# Patient Record
Sex: Female | Born: 1990 | Race: White | Hispanic: No | Marital: Married | Smoking: Never smoker
Health system: Southern US, Community
[De-identification: ages and names within clinical notes are randomized; demographics above are authoritative.]

## PROBLEM LIST (undated history)

## (undated) DIAGNOSIS — F419 Anxiety disorder, unspecified: Secondary | ICD-10-CM

## (undated) DIAGNOSIS — L503 Dermatographic urticaria: Secondary | ICD-10-CM

## (undated) DIAGNOSIS — N809 Endometriosis, unspecified: Secondary | ICD-10-CM

## (undated) DIAGNOSIS — T7840XA Allergy, unspecified, initial encounter: Secondary | ICD-10-CM

## (undated) HISTORY — DX: Anxiety disorder, unspecified: F41.9

## (undated) HISTORY — DX: Allergy, unspecified, initial encounter: T78.40XA

## (undated) HISTORY — PX: FRACTURE SURGERY: SHX138

## (undated) HISTORY — DX: Dermatographic urticaria: L50.3

## (undated) HISTORY — PX: WISDOM TOOTH EXTRACTION: SHX21

## (undated) HISTORY — DX: Endometriosis, unspecified: N80.9

---

## 2013-03-07 ENCOUNTER — Encounter (INDEPENDENT_AMBULATORY_CARE_PROVIDER_SITE_OTHER): Payer: Self-pay

## 2013-03-07 ENCOUNTER — Ambulatory Visit (INDEPENDENT_AMBULATORY_CARE_PROVIDER_SITE_OTHER): Payer: BC Managed Care – PPO | Admitting: General Practice

## 2013-03-07 VITALS — BP 125/81 | HR 74 | Temp 99.2°F | Wt 118.0 lb

## 2013-03-07 DIAGNOSIS — J329 Chronic sinusitis, unspecified: Secondary | ICD-10-CM

## 2013-03-07 MED ORDER — AZITHROMYCIN 250 MG PO TABS: ORAL_TABLET | ORAL | Status: DC

## 2013-03-07 NOTE — Progress Notes (Signed)
  Subjective:    Patient ID: Natalie Price, female    DOB: December 29, 1990, 22 y.o.   MRN: [id number]  Sinusitis This is a new problem. The current episode started in the past 7 days. The problem is unchanged. There has been no fever. Associated symptoms include congestion and sinus pressure. Pertinent negatives include no chills, coughing, headaches or sore throat. Past treatments include nothing.      Review of Systems  Constitutional: Negative for chills.  HENT: Positive for congestion, postnasal drip and sinus pressure. Negative for sore throat.   Respiratory: Negative for cough and chest tightness.   Cardiovascular: Negative for chest pain.  Neurological: Negative for dizziness, weakness and headaches.       Objective:   Physical Exam  Constitutional: She is oriented to person, place, and time. She appears well-developed and well-nourished.  HENT:  Head: Normocephalic and atraumatic.  Right Ear: External ear normal.  Left Ear: External ear normal.  Nose: Right sinus exhibits frontal sinus tenderness. Left sinus exhibits frontal sinus tenderness.  Cardiovascular: Normal rate, regular rhythm and normal heart sounds.   Pulmonary/Chest: Effort normal and breath sounds normal.  Neurological: She is alert and oriented to person, place, and time.  Skin: Skin is warm and dry.  Psychiatric: She has a normal mood and affect.          Assessment & Plan:  1. Sinusitis  - azithromycin (ZITHROMAX) 250 MG tablet; Take as directed  Dispense: 6 tablet; Refill: 0 -increase fluids -change toothbrush in 3 days -RTO if symptoms worsen or unresolved -Patient verbalized understanding Coralie Keens, FNP-C

## 2013-03-07 NOTE — Patient Instructions (Signed)

## 2013-09-03 ENCOUNTER — Ambulatory Visit (INDEPENDENT_AMBULATORY_CARE_PROVIDER_SITE_OTHER): Payer: BC Managed Care – PPO | Admitting: *Deleted

## 2013-09-03 DIAGNOSIS — Z111 Encounter for screening for respiratory tuberculosis: Secondary | ICD-10-CM

## 2013-09-03 NOTE — Patient Instructions (Signed)

## 2013-09-03 NOTE — Progress Notes (Signed)
Ppd placed and patient tolerated well

## 2013-09-06 ENCOUNTER — Encounter: Payer: Self-pay | Admitting: *Deleted

## 2013-09-06 LAB — TB SKIN TEST
Induration: 0 mm
TB Skin Test: NEGATIVE

## 2014-10-14 ENCOUNTER — Encounter (INDEPENDENT_AMBULATORY_CARE_PROVIDER_SITE_OTHER): Payer: Self-pay

## 2014-10-14 ENCOUNTER — Ambulatory Visit (INDEPENDENT_AMBULATORY_CARE_PROVIDER_SITE_OTHER): Payer: BLUE CROSS/BLUE SHIELD | Admitting: Family Medicine

## 2014-10-14 ENCOUNTER — Encounter: Payer: Self-pay | Admitting: Family Medicine

## 2014-10-14 VITALS — BP 105/70 | HR 72 | Temp 98.3°F | Ht 64.0 in | Wt 135.0 lb

## 2014-10-14 DIAGNOSIS — Z111 Encounter for screening for respiratory tuberculosis: Secondary | ICD-10-CM | POA: Diagnosis not present

## 2014-10-14 DIAGNOSIS — Z0289 Encounter for other administrative examinations: Secondary | ICD-10-CM | POA: Diagnosis not present

## 2014-10-14 DIAGNOSIS — Z020 Encounter for examination for admission to educational institution: Secondary | ICD-10-CM

## 2014-10-14 NOTE — Progress Notes (Signed)
   Subjective:    Patient ID: Natalie Price, female    DOB: [birth year], 24 y.o.   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 Basically she is healthy has no complaints or problems today. We will need to check titers complete immunizations if needed    Review of Systems  Constitutional: Negative.   HENT: Negative.   Eyes: Negative.   Respiratory: Negative.   Cardiovascular: Negative.   Gastrointestinal: Negative.   Genitourinary: Negative.   Neurological: Negative.   Psychiatric/Behavioral: Negative.        Objective:   Physical Exam  Constitutional: She is oriented to person, place, and time. She appears well-developed and well-nourished.  Eyes: Conjunctivae and EOM are normal.  Neck: Normal range of motion. Neck supple.  Cardiovascular: Normal rate, regular rhythm and normal heart sounds.   Pulmonary/Chest: Effort normal and breath sounds normal.  Abdominal: Soft. Bowel sounds are normal.  Musculoskeletal: Normal range of motion.  Neurological: She is alert and oriented to person, place, and time. She has normal reflexes.  Skin: Skin is warm and dry.  Psychiatric: She has a normal mood and affect. Her behavior is normal. Thought content normal.          Assessment & Plan:  1. Encounter for TB tine test Will probably need to step test - PPD  2. School physical exam Exam normal. Form completed

## 2014-10-15 LAB — RUBELLA SCREEN: Rubella Antibodies, IGG: 14.3 index (ref >0.99)

## 2014-10-15 LAB — VARICELLA ZOSTER ANTIBODY, IGG: Varicella zoster IgG: 962 index (ref >165)

## 2014-10-16 LAB — TB SKIN TEST
Induration: 0 mm
TB Skin Test: NEGATIVE

## 2014-11-28 ENCOUNTER — Ambulatory Visit (INDEPENDENT_AMBULATORY_CARE_PROVIDER_SITE_OTHER): Payer: BLUE CROSS/BLUE SHIELD | Admitting: *Deleted

## 2014-11-28 DIAGNOSIS — Z111 Encounter for screening for respiratory tuberculosis: Secondary | ICD-10-CM | POA: Diagnosis not present

## 2014-11-28 NOTE — Patient Instructions (Signed)

## 2014-11-28 NOTE — Progress Notes (Signed)
Ppd skin test placed on left forearm and patient tolerated well.

## 2014-11-30 ENCOUNTER — Encounter: Payer: Self-pay | Admitting: *Deleted

## 2014-11-30 LAB — TB SKIN TEST
Induration: 0 mm
TB SKIN TEST: NEGATIVE

## 2015-03-27 ENCOUNTER — Encounter: Payer: Self-pay | Admitting: Pediatrics

## 2015-03-27 ENCOUNTER — Ambulatory Visit (INDEPENDENT_AMBULATORY_CARE_PROVIDER_SITE_OTHER): Payer: BLUE CROSS/BLUE SHIELD | Admitting: Pediatrics

## 2015-03-27 VITALS — BP 119/76 | HR 80 | Temp 97.5°F | Ht 64.0 in | Wt 133.8 lb

## 2015-03-27 DIAGNOSIS — R1084 Generalized abdominal pain: Secondary | ICD-10-CM

## 2015-03-27 DIAGNOSIS — J029 Acute pharyngitis, unspecified: Secondary | ICD-10-CM | POA: Diagnosis not present

## 2015-03-27 DIAGNOSIS — K589 Irritable bowel syndrome without diarrhea: Secondary | ICD-10-CM | POA: Diagnosis not present

## 2015-03-27 LAB — POCT RAPID STREP A (OFFICE): Rapid Strep A Screen: NEGATIVE

## 2015-03-27 NOTE — Patient Instructions (Addendum)
Low FODMAP diet Chloraseptic throat Aleve

## 2015-03-27 NOTE — Progress Notes (Signed)
    Subjective:    Patient ID: Natalie Price, female    DOB: Mar 07, 1991, 24 y.o.   MRN: [id number]  CC: sore throat and abd issues  HPI: Natalie Price is a 24 y.o. female presenting on 03/27/2015 for Sore Throat  No fevers Sore throat starting 2 days ago Poor appetite yesterday Drinking chicken borth Some coughing No N/v Having bloating and gas when she is feeling well with cheese and ice cream.  Milk is fine. Oil and greasy foods make bloating worse Not having abdominal pain so much as gas pain per pt. Lasts for a few hours then goes away. Stooling at least twice a day Constipated around period Bloating/gas pain relieved by defecation  Relevant past medical, surgical, family and social history reviewed and updated as indicated. Interim medical history since our last visit reviewed. Allergies and medications reviewed and updated.   ROS: Per HPI unless specifically indicated above  Past Medical History There are no active problems to display for this patient.   No current outpatient prescriptions on file.   No current facility-administered medications for this visit.       Objective:    BP 119/76 mmHg  Pulse 80  Temp(Src) 97.5 F (36.4 C) (Oral)  Ht $R'5\' 4"'qG$  (1.626 m)  Wt 133 lb 12.8 oz (60.691 kg)  BMI 22.96 kg/m2  Wt Readings from Last 3 Encounters:  03/27/15 133 lb 12.8 oz (60.691 kg)  10/14/14 135 lb (61.236 kg)  03/07/13 118 lb (53.524 kg)     Gen: NAD, alert, cooperative with exam, NCAT EYES: EOMI, no scleral injection or icterus ENT:  TMs pearly gray b/l, OP without erythema LYMPH: no cervical LAD CV: NRRR, normal S1/S2, no murmur Resp: CTABL, no wheezes, normal WOB Abd: +BS, soft, mildly tender throughout, ND. no guarding or organomegaly, neg psoas/obturator Ext: No edema, warm Neuro: Alert and oriented     Assessment & Plan:   Natalie Price was seen today for sore throat and abdominal pain. Likely with URI, negative rapid strep. Will send for culture. Gen  abd pain/bloating worse with certain foods. Possible pt with IBS. Will check labs as below, gave pt handout for FODMAPs diet, foods to avoid and foods that are ok. F/u in 4 weeks.  Diagnoses and all orders for this visit:  Sore throat -     POCT rapid strep A -     Culture, Group A Strep  Generalized abdominal pain -     CMP14+EGFR -     CBC with Differential  Irritable bowel syndrome, unspecified type  Assunta Found, MD [facility] 03/27/2015, 12:22 PM

## 2015-03-28 ENCOUNTER — Ambulatory Visit: Payer: BLUE CROSS/BLUE SHIELD | Admitting: Family

## 2015-03-28 LAB — CMP14+EGFR
ALK PHOS: 56 IU/L (ref 39–117)
ALT: 11 IU/L (ref 0–32)
AST: 13 IU/L (ref 0–40)
Albumin/Globulin Ratio: 1.6 (ref 1.1–2.5)
Albumin: 3.9 g/dL (ref 3.5–5.5)
BILIRUBIN TOTAL: 0.2 mg/dL (ref 0.0–1.2)
BUN/Creatinine Ratio: 11 (ref 8–20)
BUN: 7 mg/dL (ref 6–20)
CHLORIDE: 102 mmol/L (ref 97–106)
CO2: 24 mmol/L (ref 18–29)
CREATININE: 0.66 mg/dL (ref 0.57–1.00)
Calcium: 9 mg/dL (ref 8.7–10.2)
GFR calc Af Amer: 142 mL/min/{1.73_m2} (ref >59)
GFR calc non Af Amer: 123 mL/min/{1.73_m2} (ref >59)
GLUCOSE: 86 mg/dL (ref 65–99)
Globulin, Total: 2.4 g/dL (ref 1.5–4.5)
Potassium: 3.9 mmol/L (ref 3.5–5.2)
SODIUM: 142 mmol/L (ref 136–144)
Total Protein: 6.3 g/dL (ref 6.0–8.5)

## 2015-03-28 LAB — CBC WITH DIFFERENTIAL/PLATELET
Basophils Absolute: 0 10*3/uL (ref 0.0–0.2)
Basos: 0 %
EOS (ABSOLUTE): 0 10*3/uL (ref 0.0–0.4)
EOS: 0 %
HEMATOCRIT: 38.9 % (ref 34.0–46.6)
Hemoglobin: 13.3 g/dL (ref 11.1–15.9)
IMMATURE GRANULOCYTES: 0 %
Immature Grans (Abs): 0 10*3/uL (ref 0.0–0.1)
Lymphocytes Absolute: 1.6 10*3/uL (ref 0.7–3.1)
Lymphs: 20 %
MCH: 32.1 pg (ref 26.6–33.0)
MCHC: 34.2 g/dL (ref 31.5–35.7)
MCV: 94 fL (ref 79–97)
Monocytes Absolute: 0.9 10*3/uL (ref 0.1–0.9)
Monocytes: 11 %
NEUTROS PCT: 69 %
Neutrophils Absolute: 5.7 10*3/uL (ref 1.4–7.0)
Platelets: 228 10*3/uL (ref 150–379)
RBC: 4.14 x10E6/uL (ref 3.77–5.28)
RDW: 12.9 % (ref 12.3–15.4)
WBC: 8.2 10*3/uL (ref 3.4–10.8)

## 2015-03-30 LAB — CULTURE, GROUP A STREP

## 2015-04-25 ENCOUNTER — Telehealth: Payer: Self-pay | Admitting: Pediatrics

## 2015-08-12 ENCOUNTER — Telehealth: Payer: Self-pay | Admitting: Pediatrics

## 2016-10-14 ENCOUNTER — Ambulatory Visit (INDEPENDENT_AMBULATORY_CARE_PROVIDER_SITE_OTHER): Payer: BLUE CROSS/BLUE SHIELD | Admitting: Family Medicine

## 2016-10-14 ENCOUNTER — Encounter: Payer: Self-pay | Admitting: Family Medicine

## 2016-10-14 VITALS — BP 119/88 | HR 70 | Temp 99.5°F | Ht 64.0 in | Wt 133.2 lb

## 2016-10-14 DIAGNOSIS — N3001 Acute cystitis with hematuria: Secondary | ICD-10-CM | POA: Diagnosis not present

## 2016-10-14 LAB — URINALYSIS, COMPLETE
Bilirubin, UA: NEGATIVE
Ketones, UA: NEGATIVE
Nitrite, UA: POSITIVE — AB
PH UA: 7 (ref 5.0–7.5)
Specific Gravity, UA: 1.015 (ref 1.005–1.030)
Urobilinogen, Ur: 0.2 mg/dL (ref 0.2–1.0)

## 2016-10-14 LAB — MICROSCOPIC EXAMINATION

## 2016-10-14 MED ORDER — CIPROFLOXACIN HCL 500 MG PO TABS: 500.0000 mg | ORAL_TABLET | Freq: Two times a day (BID) | ORAL | 0 refills | Status: DC

## 2016-10-14 NOTE — Progress Notes (Signed)
BP 119/88   Pulse 70   Temp 99.5 F (37.5 C) (Oral)   Ht 5\' 4"  (1.626 m)   Wt 133 lb 4 oz (60.4 kg)   BMI 22.87 kg/m    Subjective:    Patient ID: Natalie Price, female    DOB: [birth year], 26 y.o.   MRN: [id number]  HPI: Natalie Price is a 26 y.o. female presenting on 10/14/2016 for Urinary Tract Infection (back pain, nausea, dysuria; began last Friday, taking Azo)   HPI Dysuria and back pain and nausea Patient has been having dysuria and low back pain and nausea. This all started about 6 days ago with dysuria and lower abdominal pain. Over the past couple days and then started to go into her lower back and she has been having some possible low-grade fevers and her temperature is 99.5 here in the office today. She also had some nausea this morning when she awoke as well. She is sexually active but she has a Mirena currently and denies any possibility of being pregnant.  Relevant past medical, surgical, family and social history reviewed and updated as indicated. Interim medical history since our last visit reviewed. Allergies and medications reviewed and updated.  Review of Systems  Constitutional: Negative for chills and fever.  Respiratory: Negative for chest tightness and shortness of breath.   Cardiovascular: Negative for chest pain and leg swelling.  Gastrointestinal: Positive for abdominal pain and nausea. Negative for blood in stool, constipation, diarrhea and vomiting.  Genitourinary: Positive for dysuria, flank pain, frequency, hematuria, urgency and vaginal discharge (Very small amount, patient was not concerned about it today). Negative for difficulty urinating, vaginal bleeding and vaginal pain.  Musculoskeletal: Negative for back pain and gait problem.  Skin: Negative for rash.  Neurological: Negative for light-headedness and headaches.  Psychiatric/Behavioral: Negative for agitation and behavioral problems.  All other systems reviewed and are negative.   Per HPI  unless specifically indicated above      Objective:    BP 119/88   Pulse 70   Temp 99.5 F (37.5 C) (Oral)   Ht 5\' 4"  (1.626 m)   Wt 133 lb 4 oz (60.4 kg)   BMI 22.87 kg/m   Wt Readings from Last 3 Encounters:  10/14/16 133 lb 4 oz (60.4 kg)  03/27/15 133 lb 12.8 oz (60.7 kg)  10/14/14 135 lb (61.2 kg)    Physical Exam  Constitutional: She is oriented to person, place, and time. She appears well-developed and well-nourished. No distress.  Eyes: Conjunctivae are normal.  Cardiovascular: Normal rate, regular rhythm, normal heart sounds and intact distal pulses.   No murmur heard. Pulmonary/Chest: Effort normal and breath sounds normal. No respiratory distress. She has no wheezes. She has no rales.  Abdominal: There is no hepatosplenomegaly. There is tenderness in the suprapubic area. There is CVA tenderness. There is no rigidity, no rebound and no guarding. No hernia.  Musculoskeletal: Normal range of motion.  Neurological: She is alert and oriented to person, place, and time. Coordination normal.  Skin: Skin is warm and dry. No rash noted. She is not diaphoretic.  Psychiatric: She has a normal mood and affect. Her behavior is normal.  Nursing note and vitals reviewed.   Urinalysis: Positive leukocytes and positive nitrite    Assessment & Plan:   Problem List Items Addressed This Visit    None    Visit Diagnoses    Acute cystitis with hematuria    -  Primary   Relevant Medications  ciprofloxacin (CIPRO) 500 MG tablet   Other Relevant Orders   Urinalysis, Complete       Follow up plan: Return if symptoms worsen or fail to improve.  Counseling provided for all of the vaccine components Orders Placed This Encounter  Procedures  . Urinalysis, Complete    Caryl Pina, MD [facility] 10/14/2016, 2:04 PM

## 2018-01-30 ENCOUNTER — Ambulatory Visit: Payer: Self-pay | Admitting: Family Medicine

## 2018-01-30 ENCOUNTER — Encounter: Payer: Self-pay | Admitting: Family Medicine

## 2018-01-30 VITALS — BP 126/84 | HR 74 | Temp 98.3°F | Ht 64.0 in | Wt 148.0 lb

## 2018-01-30 DIAGNOSIS — Z111 Encounter for screening for respiratory tuberculosis: Secondary | ICD-10-CM

## 2018-01-30 DIAGNOSIS — Z020 Encounter for examination for admission to educational institution: Secondary | ICD-10-CM

## 2018-01-30 NOTE — Addendum Note (Signed)
Addended byCarrolyn Leigh on: 01/30/2018 03:34 PM   Modules accepted: Orders

## 2018-01-30 NOTE — Patient Instructions (Signed)
Please return as directed to have your TB test read.  Natalie Price will complete your paperwork at that time.  Tuberculin Skin Test Why am I having this test? Tuberculosis (TB) is a bacterial infection caused by Mycobacterium tuberculosis. Most people who are exposed to these bacteria have a strong enough defense (immune) system to prevent the bacteria from causing TB and developing symptoms. Their bodies prevent the germs from being active and making them sick (latent TB infection). However, if you have TB germs in your body and your immune system is weak, you can develop a TB infection. This can cause symptoms such as:  Night sweats.  Fever.  Weakness.  Weight loss.  A latent TB infection can also become active later in life if your immune system becomes weakened or compromised. You may have this test if your health care provider suspects that you have TB. You may also have this test to screen for TB if you are at risk for getting the disease. Those at increased risk include:  People who inject illegal drugs or share needles.  People with HIV or other diseases that affect immunity.  Health care workers.  People who live in high-risk communities, such as homeless shelters, nursing homes, and correctional facilities.  People who have been in contact with someone with TB.  People from countries where TB is more common.  If you are in a high-risk group, your health care provider may wish to screen for TB more often. This can help prevent the spread of the disease. Sometimes TB screening is required when starting a new job, such as becoming a Public house manager or a Pharmacist, hospital. Colleges or universities may require it of new students. What is being tested? A tuberculin skin test is the main test used to check for exposure to the bacteria that can cause TB. The test checks for antibodies to the bacteria. Antibodies are proteins that your body produces to protect you from germs and other things that  can make you sick. Your health care provider will inject a solution known as PPD (purified protein derivative) under the first layer of skin on your arm. This causes a blister-like bubble to form at the site. Your health care provider will then examine the site after a number of hours have passed to see if a reaction has occurred. How do I prepare for this test? There is no preparation required for this test. What do the results mean? Your test results will be reported as either negative or positive. If the tuberculin skin test produces a negative result, it is likely that you do not have TB and have not been exposed to the TB bacteria. If you or your health care provider suspects exposure, however, you may want to repeat the test a few weeks later. A blood test may also be used to check for TB. This is because you will not react to the tuberculin skin test until several weeks after exposure to TB bacteria. If you test positive to the tuberculin skin test, it is likely that you have been exposed to TB bacteria. The test does not distinguish between an active and a latent TB infection. A false-positive result can occur. A false-positive result for TB bacteria is incorrect because it indicates a condition or finding is present when it is not. Talk to your health care provider to discuss your results, treatment options, and if necessary, the need for more tests. It is your responsibility to obtain your test results. Ask  the lab or department performing the test when and how you will get your results. Talk with your health care provider if you have any questions about your results. Talk with your health care provider to discuss your results, treatment options, and if necessary, the need for more tests. Talk with your health care provider if you have any questions about your results. This information is not intended to replace advice given to you by your health care provider. Make sure you discuss any  questions you have with your health care provider. Document Released: 02/17/2005 Document Revised: 01/11/2016 Document Reviewed: 09/03/2013 Elsevier Interactive Patient Education  [employer].

## 2018-01-30 NOTE — Progress Notes (Signed)
Natalie Price is a 27 y.o. female presents to office today for annual physical exam examination.    Concerns today include: 1.  Needs school forms completed.  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  She gets her female exam/Pap performed with [facility], Dr. Lynnette Caffey.  She notes last Pap was last year and was within normal limits.  She currently has an IUD in place.  Therefore, menstrual cycles are not regular nor heavy.  Occupation: Ship broker at [employer], Substance use: none Last dental exam: regular Last pap smear: w/ OBGYN last year Refills needed today: none Immunizations needed: Flu Vaccine: YES; Needs PPD  History reviewed. No pertinent past medical history. Social History   Socioeconomic History  . Marital status: Single    Spouse name: Not on file  . Number of children: Not on file  . Years of education: Not on file  . Highest education level: Not on file  Occupational History  . Not on file  Social Needs  . Financial resource strain: Not on file  . Food insecurity:    Worry: Not on file    Inability: Not on file  . Transportation needs:    Medical: Not on file    Non-medical: Not on file  Tobacco Use  . Smoking status: Never Smoker  . Smokeless tobacco: Never Used  Substance and Sexual Activity  . Alcohol use: No  . Drug use: No  . Sexual activity: Yes  Lifestyle  . Physical activity:    Days per week: Not on file    Minutes per session: Not on file  . Stress: Not on file  Relationships  . Social connections:    Talks on phone: Not on file    Gets together: Not on file    Attends religious service: Not on file    Active member of club or organization: Not on file    Attends meetings of clubs or organizations: Not on file    Relationship status: Not on file  . Intimate partner violence:    Fear of current or ex partner: Not on file    Emotionally abused: Not on file    Physically abused: Not  on file    Forced sexual activity: Not on file  Other Topics Concern  . Not on file  Social History Narrative  . Not on file   Past Surgical History:  Procedure Laterality Date  . FRACTURE SURGERY     LEFT ARM  . WISDOM TOOTH EXTRACTION     History reviewed. No pertinent family history. No current outpatient medications on file.  No Known Allergies   ROS: Review of Systems Constitutional: negative Eyes: negative Ears, nose, mouth, throat, and face: negative Respiratory: negative Cardiovascular: negative Gastrointestinal: negative Genitourinary:negative Integument/breast: positive for skin lesion(s) Hematologic/lymphatic: negative Musculoskeletal:negative Neurological: negative Behavioral/Psych: negative Endocrine: negative Allergic/Immunologic: negative    Physical exam BP 126/84   Pulse 74   Temp 98.3 F (36.8 C) (Oral)   Ht 5\' 4"  (1.626 m)   Wt 148 lb (67.1 kg)   BMI 25.40 kg/m  General appearance: alert, cooperative, appears stated age and no distress Head: Normocephalic, without obvious abnormality, atraumatic Eyes: negative findings: lids and lashes normal, conjunctivae and sclerae normal, corneas clear and pupils equal, round, reactive to light and accomodation Ears: normal TM's and external ear canals both ears Nose: Nares normal. Septum midline. Mucosa normal. No drainage or sinus tenderness. Throat: lips, mucosa, and  tongue normal; teeth and gums normal Neck: no adenopathy, supple, symmetrical, trachea midline and thyroid not enlarged, symmetric, no tenderness/mass/nodules Back: symmetric, no curvature. ROM normal. No CVA tenderness. Lungs: clear to auscultation bilaterally Breasts: not examined Heart: regular rate and rhythm, S1, S2 normal, no murmur, click, rub or gallop Abdomen: soft, non-tender; bowel sounds normal; no masses,  no organomegaly Pelvic: not examined Extremities: extremities normal, atraumatic, no cyanosis or edema Pulses: 2+ and  symmetric Skin: Skin color, texture, turgor normal. No rashes or lesions; she has a 2-1/2 mm x 2 and half millimeter well-circumscribed flesh-colored lesion on the anterior knee on the right.  No associated fluctuance, erythema or central punctum. Lymph nodes: Cervical, supraclavicular, and axillary nodes normal. Neurologic: Grossly normal  Psych: Mood stable, speech normal, affect appropriate, pleasant, engaging Depression screen [facility] 2/9 01/30/2018 10/14/2016  Decreased Interest 0 0  Down, Depressed, Hopeless 0 0  PHQ - 2 Score 0 0    Assessment/ Plan: Natalie Price here for annual physical exam.   1. School physical exam Normal physical exam.  School form completed.  PPD test administered today.  She signed a release of information form to obtain well woman exam and Pap results from OB[facility]N at [facility].  She will call to schedule an appointment for influenza shot once is available.  2. Screening for tuberculosis  Patient to follow up in 1 year for annual exam or sooner if needed.  Aksel Bencomo M. Lajuana Ripple, DO

## 2018-02-02 LAB — TB SKIN TEST
Induration: 0 mm
TB Skin Test: NEGATIVE

## 2018-02-16 ENCOUNTER — Ambulatory Visit (INDEPENDENT_AMBULATORY_CARE_PROVIDER_SITE_OTHER): Payer: Self-pay | Admitting: *Deleted

## 2018-02-16 DIAGNOSIS — Z23 Encounter for immunization: Secondary | ICD-10-CM

## 2018-02-16 DIAGNOSIS — Z111 Encounter for screening for respiratory tuberculosis: Secondary | ICD-10-CM

## 2018-02-16 NOTE — Progress Notes (Signed)
PPD placed R forearm Influenza vaccine given Pt tolerated well

## 2018-02-18 ENCOUNTER — Encounter: Payer: Self-pay | Admitting: *Deleted

## 2018-02-18 LAB — TB SKIN TEST
INDURATION: 0 mm
TB Skin Test: NEGATIVE

## 2018-07-10 ENCOUNTER — Ambulatory Visit: Payer: Self-pay | Admitting: Family

## 2018-07-10 ENCOUNTER — Other Ambulatory Visit: Payer: Self-pay | Admitting: Family

## 2018-07-10 ENCOUNTER — Ambulatory Visit (INDEPENDENT_AMBULATORY_CARE_PROVIDER_SITE_OTHER): Payer: Self-pay | Admitting: Family

## 2018-07-10 ENCOUNTER — Encounter: Payer: Self-pay | Admitting: Family

## 2018-07-10 VITALS — BP 125/85 | HR 85 | Temp 97.6°F | Ht 64.0 in | Wt 148.0 lb

## 2018-07-10 DIAGNOSIS — N912 Amenorrhea, unspecified: Secondary | ICD-10-CM

## 2018-07-10 DIAGNOSIS — Z3200 Encounter for pregnancy test, result unknown: Secondary | ICD-10-CM

## 2018-07-10 DIAGNOSIS — Z975 Presence of (intrauterine) contraceptive device: Secondary | ICD-10-CM

## 2018-07-10 LAB — PREGNANCY, URINE: PREG TEST UR: NEGATIVE

## 2018-07-10 NOTE — Progress Notes (Signed)
   Subjective:    Patient ID: Natalie Price, female    DOB: [birth year], 29 y.o.   MRN: [id number]  Chief Complaint  Patient presents with  . check for pregnancy  . Nausea    HPI Pt presents to the office today for pregnancy testing. States she taken several pregnancy tests and has 4 positive tests in the AM, but has had several negative one if she tests in the afternoon.   States she has an IUD that placed two days ago. She states she had light amount of bleeding for 3 days on 06/27/18.   She reports nausea, vomiting, sensitive to smells, headaches, dizziness, and mild cramping.   She is sexually active.     Review of Systems  Gastrointestinal: Positive for nausea.  All other systems reviewed and are negative.      Objective:   Physical Exam Vitals signs reviewed.  Constitutional:      General: She is not in acute distress.    Appearance: She is well-developed.  HENT:     Head: Normocephalic and atraumatic.  Eyes:     Pupils: Pupils are equal, round, and reactive to light.  Neck:     Musculoskeletal: Normal range of motion and neck supple.     Thyroid: No thyromegaly.  Cardiovascular:     Rate and Rhythm: Normal rate and regular rhythm.     Heart sounds: Normal heart sounds. No murmur.  Pulmonary:     Effort: Pulmonary effort is normal. No respiratory distress.     Breath sounds: Normal breath sounds. No wheezing.  Abdominal:     General: Bowel sounds are normal. There is no distension.     Palpations: Abdomen is soft.     Tenderness: There is no abdominal tenderness.  Musculoskeletal: Normal range of motion.        General: No tenderness.  Skin:    General: Skin is warm and dry.  Neurological:     Mental Status: She is alert and oriented to person, place, and time.     Cranial Nerves: No cranial nerve deficit.     Deep Tendon Reflexes: Reflexes are normal and symmetric.  Psychiatric:        Behavior: Behavior normal.        Thought Content: Thought content  normal.        Judgment: Judgment normal.      BP 125/85   Pulse 85   Temp 97.6 F (36.4 C) (Oral)   Ht 5\' 4"  (1.626 m)   Wt 148 lb (67.1 kg)   BMI 25.40 kg/m      Assessment & Plan:  Natalie Price comes in today with chief complaint of check for pregnancy and Nausea   Diagnosis and orders addressed:  1. Absent menses - Pregnancy, urine  2. Possible pregnancy, not confirmed - Beta hCG quant (ref lab)   3. IUD (intrauterine device) in place   Labs pending to confirm pregnancy If positive will remove IUD and do referral to [place], FNP

## 2018-07-10 NOTE — Patient Instructions (Signed)
Home Pregnancy Test Information    A home pregnancy test helps you determine whether you are pregnant or not. There are several types of home pregnancy tests that can be bought at a grocery store or pharmacy.  What is being tested?  A home pregnancy test detects the presence of a hormone in your urine. The hormone is produced by cells of the placenta (human chorionic gonadotropin, or hCG). The placenta is the organ that forms to nourish and support a developing baby.  How are pregnancy tests done?  Home pregnancy tests require a urine sample.   Most kits use a plastic testing device with a strip of paper that indicates whether there is hCG in your urine.   Follow the test package instructions very carefully for how to test your urine. Depending on the test, you may need to:  ? Urinate directly onto the stick.  ? Urinate into a cup.   Wait for the results as directed by the package instructions. The amount of time may be different for each type of test.   Follow the test package instructions for how to read your test results. Depending on the test, results may be displayed as:  ? A plus or a minus sign.  ? One or two lines.  ? "Pregnant" or "not pregnant."   For best results, use your first urine of the morning. That is when the concentration of hCG is highest.  How accurate are home pregnancy tests?  Home pregnancy tests are very accurate when:   You are at least 3-[redacted] weeks pregnant.   It has been 1-2 weeks since your missed period.   You use the test according to the package instructions.  What can interfere with home pregnancy test results?  Sometimes, a home pregnancy test may report that you are pregnant when you are not pregnant (false-positive result). This can happen if you:   Are taking certain medicines, such as:  ? Medicine to control seizures.  ? Anti-anxiety medicine.  ? Fertility medicine with hCG.   Have a medical condition that affects your hormone levels.   Had a recent pregnancy loss  (miscarriage) or abortion.  Sometimes, a home pregnancy test may report that you are not pregnant when you are pregnant (false-negative result). This can happen if you:   Took the test too early in your pregnancy. Before 3-4 weeks of pregnancy, there may not be enough hCG to detect.   Drank a lot of liquid before the test.   Used an expired pregnancy test.   Are taking certain medicines, such as antihistamines or water pills (diuretics).  What should I do if I have a positive pregnancy test?  If you have a positive home pregnancy test, schedule an appointment with your health care provider. You might need additional testing to confirm the pregnancy.  What should I do if I have a negative pregnancy test?  If you have a negative home pregnancy test but still have symptoms of pregnancy, contact your health care provider. Your health care provider will test a sample of your blood to check for pregnancy. In some cases, a blood test will return a positive result even if a urine test was negative because blood tests are more sensitive. This means blood tests can detect hCG earlier than home pregnancy tests.  Follow these instructions at home:  If you are pregnant, planning to become pregnant, or think you may be pregnant:   Do not drink alcohol.   Do not   use street drugs.   Do not use any products that contain nicotine or tobacco, such as cigarettes and e-cigarettes. If you need help quitting, ask your health care provider.   Take a prenatal vitamin that contains at least 400 mcg of folic acid daily.  Summary   A home pregnancy test helps you determine whether you are pregnant or not by detecting the presence of the hormone human chorionic gonadotropin (hCG) in a sample of your urine.   Follow the test package instructions very carefully. For best results, use your first urine of the morning. That is when the concentration of hCG is highest.   Home pregnancy tests are very accurate when you are 3-[redacted] weeks  pregnant or when it has been 1-2 weeks since your missed period.   A home pregnancy test may report that you are pregnant when you are not pregnant or that you are not pregnant when you are pregnant.   Contact your health care provider to confirm your results. Your health care provider will test a sample of your blood to check for pregnancy.  This information is not intended to replace advice given to you by your health care provider. Make sure you discuss any questions you have with your health care provider.  Document Released: 05/13/2003 Document Revised: 05/23/2017 Document Reviewed: 05/23/2017  [employer] Interactive Patient Education  2019 [employer]

## 2018-07-11 ENCOUNTER — Telehealth: Payer: Self-pay | Admitting: *Deleted

## 2018-07-11 ENCOUNTER — Telehealth: Payer: Self-pay | Admitting: Family

## 2018-07-11 LAB — BETA HCG QUANT (REF LAB): hCG Quant: <1 m[IU]/mL

## 2018-07-11 NOTE — Telephone Encounter (Signed)
Requesting blood results on pregnancy.

## 2018-07-11 NOTE — Telephone Encounter (Signed)
Patient says she is sure the IUD is still in place. First morning void shows positive but  All others show neg. I suggested she go see her gyn in morning to be checked and possibly have U/s. I also told her to take her first morning void with er to the doctor. Patient stated she understood.

## 2018-07-11 NOTE — Telephone Encounter (Signed)
They are negative

## 2018-07-11 NOTE — Telephone Encounter (Signed)
Aware. 

## 2018-11-01 ENCOUNTER — Emergency Department (HOSPITAL_COMMUNITY): Payer: Self-pay

## 2018-11-01 ENCOUNTER — Encounter: Payer: Self-pay | Admitting: Family Medicine

## 2018-11-01 ENCOUNTER — Other Ambulatory Visit: Payer: Self-pay

## 2018-11-01 ENCOUNTER — Ambulatory Visit (INDEPENDENT_AMBULATORY_CARE_PROVIDER_SITE_OTHER): Payer: Self-pay | Admitting: Family Medicine

## 2018-11-01 ENCOUNTER — Encounter (HOSPITAL_COMMUNITY): Payer: Self-pay

## 2018-11-01 ENCOUNTER — Emergency Department (HOSPITAL_COMMUNITY)
Admission: EM | Admit: 2018-11-01 | Discharge: 2018-11-01 | Disposition: A | Discharge status: Home / Self Care | Payer: Self-pay | Attending: Emergency Medicine | Admitting: Emergency Medicine

## 2018-11-01 DIAGNOSIS — M545 Low back pain, unspecified: Secondary | ICD-10-CM

## 2018-11-01 DIAGNOSIS — N83202 Unspecified ovarian cyst, left side: Secondary | ICD-10-CM | POA: Insufficient documentation

## 2018-11-01 DIAGNOSIS — Z79899 Other long term (current) drug therapy: Secondary | ICD-10-CM | POA: Insufficient documentation

## 2018-11-01 DIAGNOSIS — N83201 Unspecified ovarian cyst, right side: Secondary | ICD-10-CM | POA: Insufficient documentation

## 2018-11-01 DIAGNOSIS — R109 Unspecified abdominal pain: Secondary | ICD-10-CM

## 2018-11-01 DIAGNOSIS — R1030 Lower abdominal pain, unspecified: Secondary | ICD-10-CM

## 2018-11-01 LAB — CBC WITH DIFFERENTIAL/PLATELET
Abs Immature Granulocytes: 0.05 10*3/uL (ref 0.00–0.07)
Basophils Absolute: 0 10*3/uL (ref 0.0–0.1)
Basophils Relative: 0 %
Eosinophils Absolute: 0.1 10*3/uL (ref 0.0–0.5)
Eosinophils Relative: 1 %
HCT: 43 % (ref 36.0–46.0)
Hemoglobin: 14.2 g/dL (ref 12.0–15.0)
Immature Granulocytes: 1 %
Lymphocytes Relative: 26 %
Lymphs Abs: 2.6 10*3/uL (ref 0.7–4.0)
MCH: 32.3 pg (ref 26.0–34.0)
MCHC: 33 g/dL (ref 30.0–36.0)
MCV: 97.7 fL (ref 80.0–100.0)
Monocytes Absolute: 0.5 10*3/uL (ref 0.1–1.0)
Monocytes Relative: 5 %
Neutro Abs: 6.8 10*3/uL (ref 1.7–7.7)
Neutrophils Relative %: 67 %
Platelets: 443 10*3/uL — ABNORMAL HIGH (ref 150–400)
RBC: 4.4 MIL/uL (ref 3.87–5.11)
RDW: 11.9 % (ref 11.5–15.5)
WBC: 10.1 10*3/uL (ref 4.0–10.5)
nRBC: 0 % (ref 0.0–0.2)

## 2018-11-01 LAB — URINALYSIS, ROUTINE W REFLEX MICROSCOPIC
Bilirubin Urine: NEGATIVE
Glucose, UA: NEGATIVE mg/dL
Hgb urine dipstick: NEGATIVE
Ketones, ur: NEGATIVE mg/dL
Leukocytes,Ua: NEGATIVE
Nitrite: NEGATIVE
Protein, ur: NEGATIVE mg/dL
Specific Gravity, Urine: 1.016 (ref 1.005–1.030)
pH: 5 (ref 5.0–8.0)

## 2018-11-01 LAB — COMPREHENSIVE METABOLIC PANEL
ALT: 13 U/L (ref 0–44)
AST: 15 U/L (ref 15–41)
Albumin: 4.4 g/dL (ref 3.5–5.0)
Alkaline Phosphatase: 69 U/L (ref 38–126)
Anion gap: 16 — ABNORMAL HIGH (ref 5–15)
BUN: 13 mg/dL (ref 6–20)
CO2: 19 mmol/L — ABNORMAL LOW (ref 22–32)
Calcium: 9.3 mg/dL (ref 8.9–10.3)
Chloride: 105 mmol/L (ref 98–111)
Creatinine, Ser: 0.61 mg/dL (ref 0.44–1.00)
GFR calc Af Amer: >60 mL/min (ref >60)
GFR calc non Af Amer: >60 mL/min (ref >60)
Glucose, Bld: 124 mg/dL — ABNORMAL HIGH (ref 70–99)
Potassium: 3.5 mmol/L (ref 3.5–5.1)
Sodium: 140 mmol/L (ref 135–145)
Total Bilirubin: 0.5 mg/dL (ref 0.3–1.2)
Total Protein: 7.9 g/dL (ref 6.5–8.1)

## 2018-11-01 LAB — LIPASE, BLOOD: Lipase: 22 U/L (ref 11–51)

## 2018-11-01 LAB — SEDIMENTATION RATE: Sed Rate: 25 mm/hr — ABNORMAL HIGH (ref 0–22)

## 2018-11-01 LAB — PREGNANCY, URINE: Preg Test, Ur: NEGATIVE

## 2018-11-01 LAB — C-REACTIVE PROTEIN: CRP: 1 mg/dL — ABNORMAL HIGH (ref <1.0)

## 2018-11-01 MED ORDER — FENTANYL CITRATE (PF) 100 MCG/2ML IJ SOLN
50.0000 ug | Freq: Once | INTRAMUSCULAR | Status: AC
  Administered 2018-11-01: 50 ug via INTRAVENOUS
  Filled 2018-11-01: qty 2

## 2018-11-01 MED ORDER — IOHEXOL 300 MG/ML  SOLN
100.0000 mL | Freq: Once | INTRAMUSCULAR | Status: AC | PRN
  Administered 2018-11-01: 100 mL via INTRAVENOUS

## 2018-11-01 MED ORDER — HYDROMORPHONE HCL 1 MG/ML IJ SOLN
1.0000 mg | Freq: Once | INTRAMUSCULAR | Status: AC
  Administered 2018-11-01: 1 mg via INTRAVENOUS
  Filled 2018-11-01: qty 1

## 2018-11-01 MED ORDER — NAPROXEN 375 MG PO TABS: 375.0000 mg | ORAL_TABLET | Freq: Two times a day (BID) | ORAL | 0 refills | Status: DC

## 2018-11-01 MED ORDER — SODIUM CHLORIDE 0.9 % IV BOLUS
1000.0000 mL | Freq: Once | INTRAVENOUS | Status: AC
  Administered 2018-11-01: 1000 mL via INTRAVENOUS

## 2018-11-01 MED ORDER — METOCLOPRAMIDE HCL 5 MG/ML IJ SOLN
10.0000 mg | Freq: Once | INTRAMUSCULAR | Status: AC
  Administered 2018-11-01: 10 mg via INTRAVENOUS
  Filled 2018-11-01: qty 2

## 2018-11-01 MED ORDER — ONDANSETRON HCL 4 MG/2ML IJ SOLN
4.0000 mg | Freq: Once | INTRAMUSCULAR | Status: AC
  Administered 2018-11-01: 4 mg via INTRAVENOUS
  Filled 2018-11-01: qty 2

## 2018-11-01 MED ORDER — ONDANSETRON HCL 4 MG/2ML IJ SOLN
4.0000 mg | Freq: Once | INTRAMUSCULAR | Status: AC
  Administered 2018-11-01: 14:00:00 4 mg via INTRAVENOUS
  Filled 2018-11-01: qty 2

## 2018-11-01 MED ORDER — HYDROMORPHONE HCL 1 MG/ML IJ SOLN
0.5000 mg | Freq: Once | INTRAMUSCULAR | Status: AC
  Administered 2018-11-01: 0.5 mg via INTRAVENOUS
  Filled 2018-11-01: qty 1

## 2018-11-01 MED ORDER — PROMETHAZINE HCL 25 MG/ML IJ SOLN
12.5000 mg | Freq: Once | INTRAMUSCULAR | Status: AC
  Administered 2018-11-01: 12.5 mg via INTRAVENOUS
  Filled 2018-11-01: qty 1

## 2018-11-01 MED ORDER — PROMETHAZINE HCL 25 MG PO TABS: 12.5000 mg | ORAL_TABLET | Freq: Four times a day (QID) | ORAL | 0 refills | Status: DC | PRN

## 2018-11-01 MED ORDER — OXYCODONE HCL 5 MG PO TABS: 2.5000 mg | ORAL_TABLET | Freq: Four times a day (QID) | ORAL | 0 refills | Status: DC | PRN

## 2018-11-01 NOTE — ED Notes (Signed)
Dr Pryor Curia wants to be notified after pt has ultrasound @ [phone]

## 2018-11-01 NOTE — ED Triage Notes (Signed)
Pt reports lower abd pain and back pain x 3 days.  Reports started lmp on June 1 and ended 4 days ago.  Reports period was light.  Pt started having pain 3 days ago.  Reports nausea, no vomiting or diarrhea.  Pt started vomiting after triage.  LBM was this morning.  Reports called her doctor and they wanted her to come to ED to rule out ectopic.

## 2018-11-01 NOTE — ED Provider Notes (Signed)
[facility] EMERGENCY DEPARTMENT Provider Note   CSN: [id number] Arrival date & time: 11/01/18  0941    History   Chief Complaint Chief Complaint  Patient presents with   Abdominal Pain    HPI Natalie Price is a 28 y.o. female      Flank Pain  This is a new problem. The current episode started 2 days ago. The problem occurs constantly. The problem has been gradually worsening. Associated symptoms include abdominal pain. Pertinent negatives include no shortness of breath. The symptoms are aggravated by bending, twisting, coughing and walking. Nothing relieves the symptoms. She has tried acetaminophen for the symptoms. The treatment provided no relief.  Abdominal Pain  Pain location:  Suprapubic Pain quality: aching, bloating and heavy   Pain radiates to:  Does not radiate Pain severity:  Severe Onset quality:  Gradual Duration:  2 days Timing:  Constant Progression:  Worsening Chronicity:  New Context: awakening from sleep   Context: not alcohol use, not diet changes, not eating, not laxative use, not medication withdrawal, not previous surgeries, not recent illness, not recent sexual activity, not recent travel, not retching, not sick contacts, not suspicious food intake and not trauma   Worsened by:  Movement, palpation, coughing and position changes Ineffective treatments:  OTC medications Associated symptoms: anorexia, fatigue, nausea and vomiting   Associated symptoms: no chills, no constipation, no cough, no diarrhea, no dysuria, no fever, no flatus, no hematemesis, no hematochezia, no hematuria, no melena, no shortness of breath, no sore throat, no vaginal bleeding and no vaginal discharge     History reviewed. No pertinent past medical history.  There are no active problems to display for this patient.   Past Surgical History:  Procedure Laterality Date   FRACTURE SURGERY     LEFT ARM   WISDOM TOOTH EXTRACTION       OB History    Gravida  1   Para        Term      Preterm      AB      Living        SAB      TAB      Ectopic      Multiple      Live Births               Home Medications    Prior to Admission medications   Medication Sig Start Date End Date Taking? Authorizing Provider  acetaminophen (TYLENOL) 500 MG tablet Take 1,000 mg by mouth every 6 (six) hours as needed for mild pain or moderate pain.   Yes [provider]  Prenatal Vit-Fe Fumarate-FA (PRENATAL VITAMIN PO) Take 1 tablet by mouth daily.   Yes [provider]  naproxen (NAPROSYN) 375 MG tablet Take 1 tablet (375 mg total) by mouth 2 (two) times daily. 11/01/18   [place] Fischel, Vernie Shanks, PA-C  oxyCODONE (ROXICODONE) 5 MG immediate release tablet Take 0.5-1 tablets (2.5-5 mg total) by mouth every 6 (six) hours as needed for severe pain. 11/01/18   Margarita Mail, PA-C  promethazine (PHENERGAN) 25 MG tablet Take 0.5-1 tablets (12.5-25 mg total) by mouth every 6 (six) hours as needed for nausea or vomiting. 11/01/18   Margarita Mail, PA-C    Family History No family history on file.  Social History Social History   Tobacco Use   Smoking status: Never Smoker   Smokeless tobacco: Never Used  Substance Use Topics   Alcohol use: No   Drug use:  No     Allergies   Patient has no known allergies.   Review of Systems Review of Systems  Constitutional: Positive for fatigue. Negative for chills and fever.  HENT: Negative for sore throat.   Respiratory: Negative for cough and shortness of breath.   Gastrointestinal: Positive for abdominal pain, anorexia, nausea and vomiting. Negative for constipation, diarrhea, flatus, hematemesis, hematochezia and melena.  Genitourinary: Positive for flank pain. Negative for difficulty urinating, dyspareunia, dysuria, hematuria, vaginal bleeding and vaginal discharge.  All other systems reviewed and are negative.    Physical Exam Updated Vital Signs BP 114/75    Pulse (!) 104    Temp 98.5 F  (36.9 C) (Oral)    Resp 16    Ht 5\' 4"  (1.626 m)    Wt 68 kg    LMP 10/23/2018    SpO2 99%    BMI 25.75 kg/m   Physical Exam Vitals signs and nursing note reviewed.  Constitutional:      General: She is not in acute distress.    Appearance: She is well-developed. She is not diaphoretic.  HENT:     Head: Normocephalic and atraumatic.  Eyes:     General: No scleral icterus.    Conjunctiva/sclera: Conjunctivae normal.  Neck:     Musculoskeletal: Normal range of motion.  Cardiovascular:     Rate and Rhythm: Normal rate and regular rhythm.     Heart sounds: Normal heart sounds. No murmur. No friction rub. No gallop.   Pulmonary:     Effort: Pulmonary effort is normal. No respiratory distress.     Breath sounds: Normal breath sounds.  Abdominal:     General: Bowel sounds are normal. There is no distension.     Palpations: Abdomen is rigid. There is no mass.     Tenderness: There is generalized abdominal tenderness and tenderness in the suprapubic area. There is guarding. There is no right CVA tenderness or left CVA tenderness.  Skin:    General: Skin is warm and dry.  Neurological:     Mental Status: She is alert and oriented to person, place, and time.  Psychiatric:        Behavior: Behavior normal.      ED Treatments / Results  Labs (all labs ordered are listed, but only abnormal results are displayed) Labs Reviewed  CBC WITH DIFFERENTIAL/PLATELET - Abnormal; Notable for the following components:      Result Value   Platelets 443 (*)    All other components within normal limits  COMPREHENSIVE METABOLIC PANEL - Abnormal; Notable for the following components:   CO2 19 (*)    Glucose, Bld 124 (*)    Anion gap 16 (*)    All other components within normal limits  URINALYSIS, ROUTINE W REFLEX MICROSCOPIC - Abnormal; Notable for the following components:   APPearance HAZY (*)    All other components within normal limits  SEDIMENTATION RATE - Abnormal; Notable for the  following components:   Sed Rate 25 (*)    All other components within normal limits  C-REACTIVE PROTEIN - Abnormal; Notable for the following components:   CRP 1.0 (*)    All other components within normal limits  PREGNANCY, URINE  LIPASE, BLOOD    EKG None  Radiology US Pelvis Complete  Result Date: 11/01/2018 CLINICAL DATA:  Bilateral ovarian masses. EXAM: TRANSABDOMINAL ULTRASOUND OF PELVIS DOPPLER ULTRASOUND OF OVARIES TECHNIQUE: Transabdominal ultrasound examination of the pelvis was performed including evaluation of the uterus, ovaries, adnexal  regions, and pelvic cul-de-sac. Color and duplex Doppler ultrasound was utilized to evaluate blood flow to the ovaries. COMPARISON:  CT scan November 01, 2018 FINDINGS: The study is limited as the patient refused endovaginal imaging. Uterus Measurements: 8.0 x 4.7 x 4.7 cm = volume: 91.3 mL. No fibroids or other mass visualized. Endometrium Thickness: 4 mm.  No focal abnormality visualized. Bilateral adnexa: A mass, primarily cystic, in the right adnexa measures 10 x 6.5 x 7.6 cm with a volume of 259 cc. This could represent a para ovarian mass or a large cyst measuring 9.1 x 5.8 x 8.0 cm largely replacing the right ovary. There is blood flow within the periphery of this largely cystic mass. The left adnexa demonstrates a mass measuring 9.9 x 8.8 x 10.1 cm which is largely cystic with the cystic component measuring 10.0 x 6.0 x 8.9 cm. A smaller adjacent complex cystic mass demonstrates an internal rounded hypoechoic region which could represent a solid nodule. It here in debris is possible. There is no blood flow within this nodular component. Again, there is blood flow within the periphery of this largely cystic mass. Separate ovaries are not identified. Pulsed Doppler evaluation demonstrates arterial and venous blood flow in the bilateral complex cystic masses which could be ovarian but are not definitely ovarian in origin. Other: A small amount of fluid  in the pelvis may be reactive or physiologic. IMPRESSION: 1. The study is limited as the patient declined endovaginal ultrasound due to pain. 2. There are bilateral complex cystic masses which are largely cystic in nature. Separate ovaries are not seen. The complex cystic masses could represent largely cystic replaced ovaries or para ovarian masses. Fluid-filled fallopian tubes are possible but considered less likely. There is blood flow in the periphery of these masses. One of the masses in the left ovary demonstrates a nodular hypoechoic internal component which could represent adherent debris or a small solid nodule. Differential considerations would include large functional cysts or follicles in the setting of ovarian hyperstimulation syndrome. Recommend correlation with clinical history. Cystic neoplasms are considered less likely given the bilateral nature of the findings. Hydrosalpinx/tubo-ovarian abscesses are still possible based on imaging. Recommend clinical correlation and close attention on follow-up. The patient will need follow up to resolution of these findings. Electronically Signed   By: Dorise Bullion III M.D   On: 11/01/2018 15:26   Ct Abdomen Pelvis W Contrast  Result Date: 11/01/2018 CLINICAL DATA:  Lower abdominal and pelvic pain, nausea EXAM: CT ABDOMEN AND PELVIS WITH CONTRAST TECHNIQUE: Multidetector CT imaging of the abdomen and pelvis was performed using the standard protocol following bolus administration of intravenous contrast. CONTRAST:  147mL OMNIPAQUE IOHEXOL 300 MG/ML  SOLN COMPARISON:  None. FINDINGS: Lower chest: No acute abnormality. Hepatobiliary: No solid liver abnormality is seen. No gallstones, gallbladder wall thickening, or biliary dilatation. Pancreas: Unremarkable. No pancreatic ductal dilatation or surrounding inflammatory changes. Spleen: Normal in size without significant abnormality. Adrenals/Urinary Tract: Adrenal glands are unremarkable. Kidneys are normal,  without renal calculi, solid lesion, or hydronephrosis. Bladder is unremarkable. Stomach/Bowel: Stomach is within normal limits. Appendix appears normal. The colon is decompressed although there appears to be some mucosal hyperenhancement and mural fatty stratification. Vascular/Lymphatic: No significant vascular findings are present. No enlarged abdominal or pelvic lymph nodes. Reproductive: There are multiple, large bilateral cystic lesions of the ovaries or adnexa, measuring up to 9.1 cm on the left and 8.2 cm on the right. Other: No abdominal wall hernia or abnormality. Small volume  ascites. Musculoskeletal: No acute or significant osseous findings. IMPRESSION: 1. There are multiple, large bilateral cystic lesions of the ovaries or adnexa, measuring up to 9.1 cm on the left and 8.2 cm on the right. These may be unusually large functional cysts or follicles, and these findings in combination with ascites can be seen in ovarian hyperstimulation syndrome. Correlate for hormone or medication use. These may also represent hydrosalpinx/tubo-ovarian abscess if there is a history of pelvic inflammatory disease. Please also note that any enlarged ovary can be a nidus of torsion. 2. The colon is decompressed although there appears to be some mucosal hyperenhancement and mural fatty stratification. Findings are suggestive, although not definitive for colitis, including inflammatory bowel disease such as Crohn's disease. Correlate for referable history, if present. These results were called by telephone at the time of interpretation on 11/01/2018 at 12:45 pm to [place] , who verbally acknowledged these results. Electronically Signed   By: Eddie Candle M.D.   On: 11/01/2018 12:51   [name] Art/ven Flow Abd Pelv Doppler  Result Date: 11/01/2018 CLINICAL DATA:  Bilateral ovarian masses. EXAM: TRANSABDOMINAL ULTRASOUND OF PELVIS DOPPLER ULTRASOUND OF OVARIES TECHNIQUE: Transabdominal ultrasound examination of the pelvis  was performed including evaluation of the uterus, ovaries, adnexal regions, and pelvic cul-de-sac. Color and duplex Doppler ultrasound was utilized to evaluate blood flow to the ovaries. COMPARISON:  CT scan November 01, 2018 FINDINGS: The study is limited as the patient refused endovaginal imaging. Uterus Measurements: 8.0 x 4.7 x 4.7 cm = volume: 91.3 mL. No fibroids or other mass visualized. Endometrium Thickness: 4 mm.  No focal abnormality visualized. Bilateral adnexa: A mass, primarily cystic, in the right adnexa measures 10 x 6.5 x 7.6 cm with a volume of 259 cc. This could represent a para ovarian mass or a large cyst measuring 9.1 x 5.8 x 8.0 cm largely replacing the right ovary. There is blood flow within the periphery of this largely cystic mass. The left adnexa demonstrates a mass measuring 9.9 x 8.8 x 10.1 cm which is largely cystic with the cystic component measuring 10.0 x 6.0 x 8.9 cm. A smaller adjacent complex cystic mass demonstrates an internal rounded hypoechoic region which could represent a solid nodule. It here in debris is possible. There is no blood flow within this nodular component. Again, there is blood flow within the periphery of this largely cystic mass. Separate ovaries are not identified. Pulsed Doppler evaluation demonstrates arterial and venous blood flow in the bilateral complex cystic masses which could be ovarian but are not definitely ovarian in origin. Other: A small amount of fluid in the pelvis may be reactive or physiologic. IMPRESSION: 1. The study is limited as the patient declined endovaginal ultrasound due to pain. 2. There are bilateral complex cystic masses which are largely cystic in nature. Separate ovaries are not seen. The complex cystic masses could represent largely cystic replaced ovaries or para ovarian masses. Fluid-filled fallopian tubes are possible but considered less likely. There is blood flow in the periphery of these masses. One of the masses in the left  ovary demonstrates a nodular hypoechoic internal component which could represent adherent debris or a small solid nodule. Differential considerations would include large functional cysts or follicles in the setting of ovarian hyperstimulation syndrome. Recommend correlation with clinical history. Cystic neoplasms are considered less likely given the bilateral nature of the findings. Hydrosalpinx/tubo-ovarian abscesses are still possible based on imaging. Recommend clinical correlation and close attention on follow-up. The patient will  need follow up to resolution of these findings. Electronically Signed   By: Dorise Bullion III M.D   On: 11/01/2018 15:26    Procedures Procedures (including critical care time)  Medications Ordered in ED Medications  ondansetron [facility]) injection 4 mg (4 mg Intravenous Given 11/01/18 1036)  HYDROmorphone (DILAUDID) injection 0.5 mg (0.5 mg Intravenous Given 11/01/18 1038)  sodium chloride 0.9 % bolus 1,000 mL (0 mLs Intravenous Stopped 11/01/18 1152)  metoCLOPramide (REGLAN) injection 10 mg (10 mg Intravenous Given 11/01/18 1222)  fentaNYL (SUBLIMAZE) injection 50 mcg (50 mcg Intravenous Given 11/01/18 1222)  iohexol (OMNIPAQUE) 300 MG/ML solution 100 mL (100 mLs Intravenous Contrast Given 11/01/18 1226)  HYDROmorphone (DILAUDID) injection 1 mg (1 mg Intravenous Given 11/01/18 1420)  ondansetron (ZOFRAN) injection 4 mg (4 mg Intravenous Given 11/01/18 1420)  promethazine (PHENERGAN) injection 12.5 mg (12.5 mg Intravenous Given 11/01/18 1719)     Initial Impression / Assessment and Plan / ED Course  I have reviewed the triage vital signs and the nursing notes.  Pertinent labs & imaging results that were available during my care of the patient were reviewed by me and considered in my medical decision making (see chart for details).  Clinical Course as of Nov 03 1206  Wed Nov 01, 2018  1152 CBC with Differential(!) [AH]  1152 WBC: 10.1 [AH]  1152 CBC shows no  leukocytosis, mildly elevated thrombocyte count may be acute phase reactant  Platelets(!): 443 [AH]  1152 Lipase is within normal limits  Lipase, blood [AH]  1152 Negative urine pregnancy test  Pregnancy, urine [AH]  1152 Urinalysis shows no evidence of infection  Urinalysis, Routine w reflex microscopic(!) [AH]  1153 CMP shows slightly elevated blood glucose  Comprehensive metabolic panel(!) [AH]  6761 Anion gap(!): 16 [AH]  1303 Patient with bilateral cystic ovarian masses measuring approximately 8-1/2 at night and half centimeters.  I discussed the CT scan findings with Dr. Laqueta Carina.  Patient denies taking any kind of hormone therapy currently although she is currently trying to get pregnant.  She is in a monogamous relationship with her husband.  CT ABDOMEN PELVIS W CONTRAST [AH]    Clinical Course User Index [AH] Margarita Mail, PA-C      107:47 AM  28 year old female here with abdominal pain beginning about 4 days ago but acutely worsening over the past 48 hours.  Patient states that she has been having very painful.'s for several months.  She states that she told her physician about this but has not had any further work-up.  She says that at least 1 of the days she has severe pain and her abdomen gets very rigid.  She is able to cope with it using Motrin but states that she usually cannot sleep because of the pain.  She denies any vaginal symptoms or pain with intercourse.  She is in a monogamous relationship with her husband and is currently trying to get pregnant.  She has no previous abdominal surgeries.    CC: Abdominal pain and swelling VS:  Vitals:   11/01/18 1630 11/01/18 1723 11/01/18 1725 11/01/18 1726  BP: 118/80  114/75   Pulse: 99   (!) 104  Resp: 16     Temp:  98.5 F (36.9 C)    TempSrc:  Oral    SpO2: 97%   99%  Weight:      Height:        [id number] is gathered by patient  and Nursing triage. [id number] diagnosis of her lower abdominal  considerations  include pelvic inflammatory disease, ectopic pregnancy, appendicitis, urinary calculi, primary dysmenorrhea, septic abortion, ruptured ovarian cyst or tumor, ovarian torsion, tubo-ovarian abscess, degeneration of fibroid, endometriosis, diverticulitis, cystitis.  Labs: I reviewed the labs which show mildly elevated sed rate and CRP, urine negative for infection, negative pregnancy test, mild thrombocytosis, No leukocytosis, negative lipase, mild anion gap acidosis likely from vomiting. Imaging: I personally reviewed the images (CT abdomen and pelvis, ultrasound pelvis ) which show(s) to very large cystic lesions arising from the bilateral ovaries.  Also no internal blood flow suggestions of neoplastic change, no evidence of torsion EKG: Not applicable MDM: 28 year old female with impressive bilateral ovarian cysts.  I discussed the case with Dr. Conard Novak from radiology.  I also spoke with Dr. Glo Herring who has asked that I order pelvic ultrasound to rule out torsion and he would admit her for pain control.  The patient did require multiple rounds of pain and nausea medication.  After return of the ultrasound which showed no emergent abnormalities Dr. Glo Herring decided that this would be best performed on an outpatient basis.  In a long discussion with the patient about follow-up, disposition, appropriate reasons to seek immediate medical care including intolerable pain.  The patient patient was able to ambulate here in the emergency department.  She is also able to tolerate some fluids.  She will follow closely with Dr. Glo Herring for operative management. Patient disposition: Discharge Patient condition: Stable. The patient appears reasonably screened and/or stabilized for discharge and I doubt any other medical condition or other [facility] requiring further screening, evaluation, or treatment in the ED at this time prior to discharge. I have discussed lab and/or imaging findings with the patient and answered all  questions/concerns to the best of my ability. I have discussed return precautions and OP follow up.      Final Clinical Impressions(s) / ED Diagnoses   Final diagnoses:  Cysts of both ovaries  Continuous severe abdominal pain    ED Discharge Orders         Ordered    oxyCODONE (ROXICODONE) 5 MG immediate release tablet  Every 6 hours PRN     11/01/18 1711    promethazine (PHENERGAN) 25 MG tablet  Every 6 hours PRN     11/01/18 1711    naproxen (NAPROSYN) 375 MG tablet  2 times daily     11/01/18 1711           Margarita Mail, PA-C 11/03/18 1210    Maudie Flakes, MD 11/06/18 1102

## 2018-11-01 NOTE — Discharge Instructions (Addendum)
Please take colace stool softener 2 x a day Take Miralax - 1 capful in 8-10 oz of water 3 x a day. You may take 6-10 capfuls in 32 oz of fluid if you get backed up.  Please return immediately if your pain is uncontrolled.  Make sure to follow closely with Dr. Glo Herring.  Get help right away if: You have abdominal pain that is severe or gets worse. You cannot eat or drink without vomiting. You suddenly develop a fever. Your menstrual period is much heavier than usual.

## 2018-11-01 NOTE — Progress Notes (Signed)
Virtual Visit via telephone Note Due to COVID-19, visit is conducted virtually and was requested by patient. This visit type was conducted due to national recommendations for restrictions regarding the COVID-19 Pandemic (e.g. social distancing) in an effort to limit this patient's exposure and mitigate transmission in our community. All issues noted in this document were discussed and addressed.  A physical exam was not performed with this format.   I connected with [name] on 11/01/18 at 0850 by telephone and verified that I am speaking with the correct person using two identifiers. [name] is currently located at home and no one is currently with them during visit. The provider, Monia Pouch, FNP is located in their office at time of visit.  I discussed the limitations, risks, security and privacy concerns of performing an evaluation and management service by telephone and the availability of in person appointments. I also discussed with the patient that there may be a patient responsible charge related to this service. The patient expressed understanding and agreed to proceed.  Subjective:  Patient ID: Natalie Price, female    DOB: 09-25-1990, 28 y.o.   MRN: [id number]  Chief Complaint:  Back Pain and Abdominal Pain   HPI: Natalie Price is a 28 y.o. female presenting on 11/01/2018 for Back Pain and Abdominal Pain   Pt reports midline lower back pain that started 2 days ago. States the pain has now radiated to her lower abdomen and is severe. States she is to the point of tears due to the severity of the pain. States the pain is sharp, shooting, and cramping. She denies injury. She reports having her IUD removed a few moths ago because her and her husband are trying to get pregnant. She states her LMP was about 5 days ago. States it was very light and very short. She is unsure if she is pregnant, states it is a possibility. No dysuria or vaginal symptoms. No pelvic pain. No  saddle anesthesia, fever, chills, weakness, loss of bowel or bladder, weight changes, or rectal bleeding. She does have nausea. No vomiting, diarrhea, or constipation.  Back Pain  This is a new problem. The current episode started in the past 7 days. The problem occurs constantly. The problem has been gradually worsening since onset. The pain is present in the lumbar spine and sacro-iliac. The quality of the pain is described as cramping, shooting and aching. The pain is at a severity of 10/10. The pain is severe. The pain is the same all the time. Associated symptoms include abdominal pain. Pertinent negatives include no bladder incontinence, bowel incontinence, chest pain, dysuria, fever, headaches, leg pain, numbness, paresis, paresthesias, pelvic pain, perianal numbness, tingling, weakness or weight loss. She has tried analgesics for the symptoms. The treatment provided no relief.  Abdominal Pain  This is a new problem. The current episode started yesterday. The onset quality is sudden. The problem occurs constantly. The problem has been gradually worsening. The pain is located in the suprapubic region, LLQ and RLQ. The pain is at a severity of 10/10. The pain is severe. The quality of the pain is aching, cramping and sharp. The abdominal pain radiates to the back. Associated symptoms include nausea. Pertinent negatives include no anorexia, arthralgias, belching, constipation, diarrhea, dysuria, fever, flatus, frequency, headaches, hematochezia, hematuria, melena, myalgias, vomiting or weight loss. The pain is relieved by nothing. She has tried acetaminophen for the symptoms. The treatment provided no relief.     Relevant past medical, surgical, family, and social  history reviewed and updated as indicated.  Allergies and medications reviewed and updated.   History reviewed. No pertinent past medical history.  Past Surgical History:  Procedure Laterality Date   FRACTURE SURGERY     LEFT ARM    WISDOM TOOTH EXTRACTION      Social History   Socioeconomic History   Marital status: Single    Spouse name: Not on file   Number of children: Not on file   Years of education: Not on file   Highest education level: Not on file  Occupational History   Not on file  Social Needs   Financial resource strain: Not on file   Food insecurity:    Worry: Not on file    Inability: Not on file   Transportation needs:    Medical: Not on file    Non-medical: Not on file  Tobacco Use   Smoking status: Never Smoker   Smokeless tobacco: Never Used  Substance and Sexual Activity   Alcohol use: No   Drug use: No   Sexual activity: Yes  Lifestyle   Physical activity:    Days per week: Not on file    Minutes per session: Not on file   Stress: Not on file  Relationships   Social connections:    Talks on phone: Not on file    Gets together: Not on file    Attends religious service: Not on file    Active member of club or organization: Not on file    Attends meetings of clubs or organizations: Not on file    Relationship status: Not on file   Intimate partner violence:    Fear of current or ex partner: Not on file    Emotionally abused: Not on file    Physically abused: Not on file    Forced sexual activity: Not on file  Other Topics Concern   Not on file  Social History Narrative   Starting Surg Tech program at [employer]    No outpatient encounter medications on file as of 11/01/2018.   No facility-administered encounter medications on file as of 11/01/2018.     No Known Allergies  Review of Systems  Constitutional: Negative for activity change, appetite change, chills, diaphoresis, fatigue, fever, unexpected weight change and weight loss.  Respiratory: Negative for cough, chest tightness and shortness of breath.   Cardiovascular: Negative for chest pain, palpitations and leg swelling.  Gastrointestinal: Positive for abdominal pain and nausea. Negative for abdominal  distention, anal bleeding, anorexia, blood in stool, bowel incontinence, constipation, diarrhea, flatus, hematochezia, melena, rectal pain and vomiting.  Genitourinary: Negative for bladder incontinence, decreased urine volume, difficulty urinating, dyspareunia, dysuria, enuresis, flank pain, frequency, genital sores, hematuria, menstrual problem, pelvic pain, urgency, vaginal bleeding, vaginal discharge and vaginal pain.  Musculoskeletal: Positive for back pain. Negative for arthralgias and myalgias.  Neurological: Negative for dizziness, tingling, tremors, seizures, syncope, facial asymmetry, speech difficulty, weakness, light-headedness, numbness, headaches and paresthesias.  Psychiatric/Behavioral: Negative for confusion.  All other systems reviewed and are negative.        Observations/Objective: No vital signs or physical exam, this was a telephone or virtual health encounter.  Pt alert and oriented, answers all questions appropriately, and able to speak in full sentences.    Assessment and Plan: Abril was seen today for back pain.  Diagnoses and all orders for this visit:  Acute midline low back pain without sciatica Lower abdominal pain Reported symptoms concerning for possible ectopic pregnancy. Pt and husband  are actively trying to get pregnant. Pt states the pain is to the point of crying. Very light and short LMP per pt report. No fever, chills, vomiting, dysuria, constipation, or diarrhea. No vaginal discharge or pain. Recommended for the pt to go to the ED for evaluation and treatment due to severity of pain and concerns for possible ectopic pregnancy.     Follow Up Instructions: Return in about 4 weeks (around 11/29/2018), or if symptoms worsen or fail to improve, for back pain.    I discussed the assessment and treatment plan with the patient. The patient was provided an opportunity to ask questions and all were answered. The patient agreed with the plan and demonstrated  an understanding of the instructions.   The patient was advised to call back or seek an in-person evaluation if the symptoms worsen or if the condition fails to improve as anticipated.  The above assessment and management plan was discussed with the patient. The patient verbalized understanding of and has agreed to the management plan. Patient is aware to call the clinic if symptoms persist or worsen. Patient is aware when to return to the clinic for a follow-up visit. Patient educated on when it is appropriate to go to the emergency department.    I provided 15 minutes of non-face-to-face time during this encounter. The call started at 0850. The call ended at 0905. The other time was used for coordination of care.    Monia Pouch, FNP-C [facility] [address] [place], [place] [address] [phone]

## 2018-11-01 NOTE — ED Notes (Signed)
Pt was informed that we need a urine sample. 

## 2018-11-03 ENCOUNTER — Telehealth: Payer: Self-pay | Admitting: Obstetrics and Gynecology

## 2018-11-03 NOTE — Telephone Encounter (Signed)
Pt seen you at the hospital and she was told to call and f/u with you over the phone. Advised her that Ferg would be in the office on Monday and we would have a nurse call her

## 2018-11-03 NOTE — Telephone Encounter (Signed)
Patient seen in ED , has bilateral ovarian cysts. Could me managed as a televisit first. 8:30 Monday's fine.

## 2018-11-06 ENCOUNTER — Encounter: Payer: Self-pay | Admitting: Obstetrics and Gynecology

## 2018-11-06 ENCOUNTER — Ambulatory Visit (INDEPENDENT_AMBULATORY_CARE_PROVIDER_SITE_OTHER): Payer: Self-pay | Admitting: Obstetrics and Gynecology

## 2018-11-06 ENCOUNTER — Other Ambulatory Visit: Payer: Self-pay

## 2018-11-06 ENCOUNTER — Telehealth: Payer: Self-pay | Admitting: Obstetrics and Gynecology

## 2018-11-06 DIAGNOSIS — N838 Other noninflammatory disorders of ovary, fallopian tube and broad ligament: Secondary | ICD-10-CM

## 2018-11-06 MED ORDER — TRAMADOL HCL 50 MG PO TABS: 50.0000 mg | ORAL_TABLET | Freq: Four times a day (QID) | ORAL | 0 refills | Status: DC | PRN

## 2018-11-06 NOTE — Progress Notes (Signed)
Patient ID: Natalie Price, female   DOB: [birth year], 28 y.o.   MRN: [id number]    TELEHEALTH VIRTUAL GYNECOLOGY VISIT ENCOUNTER NOTE  I connected with Natalie Price on 6/15/2020tra, at  2:15 PM EDT by telephone at home and verified that I am speaking with the correct person using two identifiers.   I discussed the limitations, risks, security and privacy concerns of performing an evaluation and management service by telephone and the availability of in person appointments. I also discussed with the patient that there may be a patient responsible charge related to this service. The patient expressed understanding and agreed to proceed.   History:  Natalie Price is a 28 y.o. G1P0 female being evaluated today for follow-up of the bilateral ovarian cyst noted on emergency room visit the other day which she presented to any [facility] emergency room.  CT scan showed bilateral 9 cm ovarian cysts and ultrasound showed them to be primarily cystic with blood flow in the periphery suggestive that there was no evidence of torsion.  There is no solid components and both lesions are considered primarily cystic.  There is no prior comparison studies.  she denies any abnormal vaginal discharge, bleeding, pelvic pain or other concerns.    Pain is tolerable to the patient she has a mild lower abdominal discomfort all the time particularly worse with her menses, and she reports is having been going on for at least 3 months that she had her IUD removed and attempted pregnancy she denies any specific focal dyspareunia but does notice pelvic pressure   No past medical history on file. Past Surgical History:  Procedure Laterality Date   FRACTURE SURGERY     LEFT ARM   WISDOM TOOTH EXTRACTION     The following portions of the patient's history were reviewed and updated as appropriate: allergies, current medications, past family history, past medical history, past social history, past surgical history and problem list.    Health Maintenance:  No pap on file.  Review of Systems:  Pertinent items noted in HPI and remainder of comprehensive ROS otherwise negative.  Physical Exam:   General:  Alert, oriented and cooperative.   Mental Status: Normal mood and affect perceived. Normal judgment and thought content.  Physical exam deferred due to nature of the encounter  Labs and Imaging Results for orders placed or performed during the hospital encounter of 11/01/18 (from the past 336 hour(s))  CBC with Differential   Collection Time: 11/01/18 10:02 AM  Result Value Ref Range   WBC 10.1 4.0 - 10.5 K/uL   RBC 4.40 3.87 - 5.11 MIL/uL   Hemoglobin 14.2 12.0 - 15.0 g/dL   HCT 43.0 36.0 - 46.0 %   MCV 97.7 80.0 - 100.0 fL   MCH 32.3 26.0 - 34.0 pg   MCHC 33.0 30.0 - 36.0 g/dL   RDW 11.9 11.5 - 15.5 %   Platelets 443 (H) 150 - 400 K/uL   nRBC 0.0 0.0 - 0.2 %   Neutrophils Relative % 67 %   Neutro Abs 6.8 1.7 - 7.7 K/uL   Lymphocytes Relative 26 %   Lymphs Abs 2.6 0.7 - 4.0 K/uL   Monocytes Relative 5 %   Monocytes Absolute 0.5 0.1 - 1.0 K/uL   Eosinophils Relative 1 %   Eosinophils Absolute 0.1 0.0 - 0.5 K/uL   Basophils Relative 0 %   Basophils Absolute 0.0 0.0 - 0.1 K/uL   Immature Granulocytes 1 %   Abs Immature Granulocytes 0.05 0.00 -  0.07 K/uL  Comprehensive metabolic panel   Collection Time: 11/01/18 10:02 AM  Result Value Ref Range   Sodium 140 135 - 145 mmol/L   Potassium 3.5 3.5 - 5.1 mmol/L   Chloride 105 98 - 111 mmol/L   CO2 19 (L) 22 - 32 mmol/L   Glucose, Bld 124 (H) 70 - 99 mg/dL   BUN 13 6 - 20 mg/dL   Creatinine, Ser 0.61 0.44 - 1.00 mg/dL   Calcium 9.3 8.9 - 10.3 mg/dL   Total Protein 7.9 6.5 - 8.1 g/dL   Albumin 4.4 3.5 - 5.0 g/dL   AST 15 15 - 41 U/L   ALT 13 0 - 44 U/L   Alkaline Phosphatase 69 38 - 126 U/L   Total Bilirubin 0.5 0.3 - 1.2 mg/dL   GFR calc non Af Amer >60 >60 mL/min   GFR calc Af Amer >60 >60 mL/min   Anion gap 16 (H) 5 - 15  Urinalysis, Routine w  reflex microscopic   Collection Time: 11/01/18 10:02 AM  Result Value Ref Range   Color, Urine YELLOW YELLOW   APPearance HAZY (A) CLEAR   Specific Gravity, Urine 1.016 1.005 - 1.030   pH 5.0 5.0 - 8.0   Glucose, UA NEGATIVE NEGATIVE mg/dL   Hgb urine dipstick NEGATIVE NEGATIVE   Bilirubin Urine NEGATIVE NEGATIVE   Ketones, ur NEGATIVE NEGATIVE mg/dL   Protein, ur NEGATIVE NEGATIVE mg/dL   Nitrite NEGATIVE NEGATIVE   Leukocytes,Ua NEGATIVE NEGATIVE  Pregnancy, urine   Collection Time: 11/01/18 10:02 AM  Result Value Ref Range   Preg Test, Ur NEGATIVE NEGATIVE  Sedimentation rate   Collection Time: 11/01/18 10:02 AM  Result Value Ref Range   Sed Rate 25 (H) 0 - 22 mm/hr  C-reactive protein   Collection Time: 11/01/18 10:02 AM  Result Value Ref Range   CRP 1.0 (H) <1.0 mg/dL  Lipase, blood   Collection Time: 11/01/18 10:14 AM  Result Value Ref Range   Lipase 22 11 - 51 U/L   US Pelvis Complete  Result Date: 11/01/2018 CLINICAL DATA:  Bilateral ovarian masses. EXAM: TRANSABDOMINAL ULTRASOUND OF PELVIS DOPPLER ULTRASOUND OF OVARIES TECHNIQUE: Transabdominal ultrasound examination of the pelvis was performed including evaluation of the uterus, ovaries, adnexal regions, and pelvic cul-de-sac. Color and duplex Doppler ultrasound was utilized to evaluate blood flow to the ovaries. COMPARISON:  CT scan November 01, 2018 FINDINGS: The study is limited as the patient refused endovaginal imaging. Uterus Measurements: 8.0 x 4.7 x 4.7 cm = volume: 91.3 mL. No fibroids or other mass visualized. Endometrium Thickness: 4 mm.  No focal abnormality visualized. Bilateral adnexa: A mass, primarily cystic, in the right adnexa measures 10 x 6.5 x 7.6 cm with a volume of 259 cc. This could represent a para ovarian mass or a large cyst measuring 9.1 x 5.8 x 8.0 cm largely replacing the right ovary. There is blood flow within the periphery of this largely cystic mass. The left adnexa demonstrates a mass measuring  9.9 x 8.8 x 10.1 cm which is largely cystic with the cystic component measuring 10.0 x 6.0 x 8.9 cm. A smaller adjacent complex cystic mass demonstrates an internal rounded hypoechoic region which could represent a solid nodule. It here in debris is possible. There is no blood flow within this nodular component. Again, there is blood flow within the periphery of this largely cystic mass. Separate ovaries are not identified. Pulsed Doppler evaluation demonstrates arterial and venous blood flow in the  bilateral complex cystic masses which could be ovarian but are not definitely ovarian in origin. Other: A small amount of fluid in the pelvis may be reactive or physiologic. IMPRESSION: 1. The study is limited as the patient declined endovaginal ultrasound due to pain. 2. There are bilateral complex cystic masses which are largely cystic in nature. Separate ovaries are not seen. The complex cystic masses could represent largely cystic replaced ovaries or para ovarian masses. Fluid-filled fallopian tubes are possible but considered less likely. There is blood flow in the periphery of these masses. One of the masses in the left ovary demonstrates a nodular hypoechoic internal component which could represent adherent debris or a small solid nodule. Differential considerations would include large functional cysts or follicles in the setting of ovarian hyperstimulation syndrome. Recommend correlation with clinical history. Cystic neoplasms are considered less likely given the bilateral nature of the findings. Hydrosalpinx/tubo-ovarian abscesses are still possible based on imaging. Recommend clinical correlation and close attention on follow-up. The patient will need follow up to resolution of these findings. Electronically Signed   By: Dorise Bullion III M.D   On: 11/01/2018 15:26   Ct Abdomen Pelvis W Contrast  Result Date: 11/01/2018 CLINICAL DATA:  Lower abdominal and pelvic pain, nausea EXAM: CT ABDOMEN AND PELVIS  WITH CONTRAST TECHNIQUE: Multidetector CT imaging of the abdomen and pelvis was performed using the standard protocol following bolus administration of intravenous contrast. CONTRAST:  179mL OMNIPAQUE IOHEXOL 300 MG/ML  SOLN COMPARISON:  None. FINDINGS: Lower chest: No acute abnormality. Hepatobiliary: No solid liver abnormality is seen. No gallstones, gallbladder wall thickening, or biliary dilatation. Pancreas: Unremarkable. No pancreatic ductal dilatation or surrounding inflammatory changes. Spleen: Normal in size without significant abnormality. Adrenals/Urinary Tract: Adrenal glands are unremarkable. Kidneys are normal, without renal calculi, solid lesion, or hydronephrosis. Bladder is unremarkable. Stomach/Bowel: Stomach is within normal limits. Appendix appears normal. The colon is decompressed although there appears to be some mucosal hyperenhancement and mural fatty stratification. Vascular/Lymphatic: No significant vascular findings are present. No enlarged abdominal or pelvic lymph nodes. Reproductive: There are multiple, large bilateral cystic lesions of the ovaries or adnexa, measuring up to 9.1 cm on the left and 8.2 cm on the right. Other: No abdominal wall hernia or abnormality. Small volume ascites. Musculoskeletal: No acute or significant osseous findings. IMPRESSION: 1. There are multiple, large bilateral cystic lesions of the ovaries or adnexa, measuring up to 9.1 cm on the left and 8.2 cm on the right. These may be unusually large functional cysts or follicles, and these findings in combination with ascites can be seen in ovarian hyperstimulation syndrome. Correlate for hormone or medication use. These may also represent hydrosalpinx/tubo-ovarian abscess if there is a history of pelvic inflammatory disease. Please also note that any enlarged ovary can be a nidus of torsion. 2. The colon is decompressed although there appears to be some mucosal hyperenhancement and mural fatty stratification.  Findings are suggestive, although not definitive for colitis, including inflammatory bowel disease such as Crohn's disease. Correlate for referable history, if present. These results were called by telephone at the time of interpretation on 11/01/2018 at 12:45 pm to [place] , who verbally acknowledged these results. Electronically Signed   By: Eddie Candle M.D.   On: 11/01/2018 12:51   [name] Art/ven Flow Abd Pelv Doppler  Result Date: 11/01/2018 CLINICAL DATA:  Bilateral ovarian masses. EXAM: TRANSABDOMINAL ULTRASOUND OF PELVIS DOPPLER ULTRASOUND OF OVARIES TECHNIQUE: Transabdominal ultrasound examination of the pelvis was performed including evaluation of  the uterus, ovaries, adnexal regions, and pelvic cul-de-sac. Color and duplex Doppler ultrasound was utilized to evaluate blood flow to the ovaries. COMPARISON:  CT scan November 01, 2018 FINDINGS: The study is limited as the patient refused endovaginal imaging. Uterus Measurements: 8.0 x 4.7 x 4.7 cm = volume: 91.3 mL. No fibroids or other mass visualized. Endometrium Thickness: 4 mm.  No focal abnormality visualized. Bilateral adnexa: A mass, primarily cystic, in the right adnexa measures 10 x 6.5 x 7.6 cm with a volume of 259 cc. This could represent a para ovarian mass or a large cyst measuring 9.1 x 5.8 x 8.0 cm largely replacing the right ovary. There is blood flow within the periphery of this largely cystic mass. The left adnexa demonstrates a mass measuring 9.9 x 8.8 x 10.1 cm which is largely cystic with the cystic component measuring 10.0 x 6.0 x 8.9 cm. A smaller adjacent complex cystic mass demonstrates an internal rounded hypoechoic region which could represent a solid nodule. It here in debris is possible. There is no blood flow within this nodular component. Again, there is blood flow within the periphery of this largely cystic mass. Separate ovaries are not identified. Pulsed Doppler evaluation demonstrates arterial and venous blood flow in  the bilateral complex cystic masses which could be ovarian but are not definitely ovarian in origin. Other: A small amount of fluid in the pelvis may be reactive or physiologic. IMPRESSION: 1. The study is limited as the patient declined endovaginal ultrasound due to pain. 2. There are bilateral complex cystic masses which are largely cystic in nature. Separate ovaries are not seen. The complex cystic masses could represent largely cystic replaced ovaries or para ovarian masses. Fluid-filled fallopian tubes are possible but considered less likely. There is blood flow in the periphery of these masses. One of the masses in the left ovary demonstrates a nodular hypoechoic internal component which could represent adherent debris or a small solid nodule. Differential considerations would include large functional cysts or follicles in the setting of ovarian hyperstimulation syndrome. Recommend correlation with clinical history. Cystic neoplasms are considered less likely given the bilateral nature of the findings. Hydrosalpinx/tubo-ovarian abscesses are still possible based on imaging. Recommend clinical correlation and close attention on follow-up. The patient will need follow up to resolution of these findings. Electronically Signed   By: Dorise Bullion III M.D   On: 11/01/2018 15:26      Assessment and Plan:     A: Bilateral simple ovarian cyst    P: We will check Ca1 25 and recheck sed rate which was mildly elevated in the emergency room.  Will follow patient for 6 weeks and repeat ultrasound for any possible changes in cyst size.  Will most likely need bilateral cystectomy   I discussed the assessment and treatment plan with the patient. The patient was provided an opportunity to ask questions and all were answered. The patient agreed with the plan and demonstrated an understanding of the instructions.   The patient was advised to call back or seek an in-person evaluation/go to the ED if the symptoms  worsen or if the condition fails to improve as anticipated.  I provided 15 minutes of non-face-to-face time during this encounter.   By signing my name below, I, Samul Dada, attest that this documentation has been prepared under the direction and in the presence of Jonnie Kind, MD. Electronically Signed: [place]. 11/06/18. 2:50 PM.  I personally performed the services described in this  documentation, which was SCRIBED in my presence. The recorded information has been reviewed and considered accurate. It has been edited as necessary during review. Jonnie Kind, MD

## 2018-11-06 NOTE — Telephone Encounter (Signed)
Pt scheduled for tele visit

## 2018-11-06 NOTE — Telephone Encounter (Signed)
Patient called stating that she would like a call from Dr. Glo Herring. Pt states that she was suppose to give him a call Friday but Dr. Glo Herring was not here. Please contact pt

## 2018-11-07 ENCOUNTER — Telehealth: Payer: Self-pay | Admitting: Obstetrics and Gynecology

## 2018-11-07 NOTE — Telephone Encounter (Signed)
Pt states during her appt yesterday Dr. Glo Herring mentioned calling in birth control for her ovarian cyst but the medication has not been sent in. Can this be sent?

## 2018-11-08 LAB — SEDIMENTATION RATE: Sed Rate: 13 mm/hr (ref 0–32)

## 2018-11-08 LAB — CA 125: Cancer Antigen (CA) 125: 581 U/mL — ABNORMAL HIGH (ref 0.0–38.1)

## 2018-11-08 NOTE — Telephone Encounter (Signed)
Unable to reach patient on her cell phone numbers.  Message left for patient to call me to discuss her elevated Ca1 25 levels.  I will be referring the patient to Dr. Denman George for her consultation regarding the ovarian masses elevated Ca125 of 581.0

## 2018-11-09 ENCOUNTER — Other Ambulatory Visit: Payer: Self-pay | Admitting: Obstetrics and Gynecology

## 2018-11-09 DIAGNOSIS — D4959 Neoplasm of unspecified behavior of other genitourinary organ: Secondary | ICD-10-CM

## 2018-11-09 NOTE — Telephone Encounter (Signed)
Natalie Price returned a call to me later last night, and I discussed plans for referral and concern of possible ovarian malignancy. Pt questions answered,

## 2018-11-09 NOTE — Progress Notes (Signed)
Consult to Gyn Oncology order placed. Patient has been notified yesterday.

## 2018-11-10 ENCOUNTER — Telehealth: Payer: Self-pay | Admitting: *Deleted

## 2018-11-10 NOTE — Telephone Encounter (Signed)
Called and spoke with the patient regarding her referral. Scheduled the patient to see Dr. Denman George on 6/26. Gave instructions, address and procedures for checkin, mask parking

## 2018-11-17 ENCOUNTER — Inpatient Hospital Stay: Payer: Self-pay | Attending: Gynecologic Oncology | Admitting: Gynecologic Oncology

## 2018-11-17 ENCOUNTER — Other Ambulatory Visit: Payer: Self-pay

## 2018-11-17 ENCOUNTER — Encounter: Payer: Self-pay | Admitting: Oncology

## 2018-11-17 ENCOUNTER — Encounter: Payer: Self-pay | Admitting: Gynecologic Oncology

## 2018-11-17 VITALS — BP 132/84 | HR 80 | Temp 97.9°F | Resp 18 | Ht 64.0 in | Wt 153.1 lb

## 2018-11-17 DIAGNOSIS — R971 Elevated cancer antigen 125 [CA 125]: Secondary | ICD-10-CM | POA: Insufficient documentation

## 2018-11-17 DIAGNOSIS — R188 Other ascites: Secondary | ICD-10-CM | POA: Insufficient documentation

## 2018-11-17 DIAGNOSIS — N83202 Unspecified ovarian cyst, left side: Secondary | ICD-10-CM | POA: Insufficient documentation

## 2018-11-17 DIAGNOSIS — N83201 Unspecified ovarian cyst, right side: Secondary | ICD-10-CM | POA: Insufficient documentation

## 2018-11-17 NOTE — Progress Notes (Signed)
Met with Natalie Price after her consult appointment with Dr. Denman George.  Went over AVS with preop instructions.  Advised her to call with any questions or concerns.

## 2018-11-17 NOTE — Patient Instructions (Signed)
Preparing for your Surgery  Plan for surgery on 12/05/2018 with Dr. Everitt Amber at [place] will be scheduled for a robotic bilateral ovarian cystectomy, possible bilateral salpingectomy, possible salpingo-oophorectomy, possible staging.   Pre-operative Testing -You will receive a phone call from presurgical testing at [facility] to arrange for a pre-operative testing appointment before your surgery.  This appointment normally occurs one to two weeks before your scheduled surgery.   -Bring your insurance card, copy of an advanced directive if applicable, medication list  -At that visit, you will be asked to sign a consent for a possible blood transfusion in case a transfusion becomes necessary during surgery.  The need for a blood transfusion is rare but having consent is a necessary part of your care.     -You should not be taking blood thinners or aspirin at least ten days prior to surgery unless instructed by your surgeon.  Day Before Surgery at [facility] will be asked to take in a light diet the day before surgery.  Avoid carbonated beverages.  You will be advised to have nothing to eat or drink after midnight the evening before.    Eat a light diet the day before surgery.  Examples including soups, broths, toast, yogurt, mashed potatoes.  Things to avoid include carbonated beverages (fizzy beverages), raw fruits and raw vegetables, or beans.   If your bowels are filled with gas, your surgeon will have difficulty visualizing your pelvic organs which increases your surgical risks.  Your role in recovery Your role is to become active as soon as directed by your doctor, while still giving yourself time to heal.  Rest when you feel tired. You will be asked to do the following in order to speed your recovery:  - Cough and breathe deeply. This helps toclear and expand your lungs and can prevent pneumonia. You may be given a spirometer to practice deep  breathing. A staff member will show you how to use the spirometer. - Do mild physical activity. Walking or moving your legs help your circulation and body functions return to normal. A staff member will help you when you try to walk and will provide you with simple exercises. Do not try to get up or walk alone the first time. - Actively manage your pain. Managing your pain lets you move in comfort. We will ask you to rate your pain on a scale of zero to 10. It is your responsibility to tell your doctor or nurse where and how much you hurt so your pain can be treated.  Special Considerations -If you are diabetic, you may be placed on insulin after surgery to have closer control over your blood sugars to promote healing and recovery.  This does not mean that you will be discharged on insulin.  If applicable, your oral antidiabetics will be resumed when you are tolerating a solid diet.  -Your final pathology results from surgery should be available around one week after surgery and the results will be relayed to you when available.  -Dr. Lahoma Crocker is the Surgeon that assists your GYN Oncologist with surgery.  The next day after your surgery you will either see your GYN Oncologist, Dr. Everitt Amber, or Dr. Lahoma Crocker.  -FMLA forms can be faxed to [phone] and please allow 5-7 business days for completion.  Eat a light diet the day before surgery.  Examples including soups, broths, toast, yogurt, mashed potatoes.  Things to avoid include carbonated beverages (fizzy  beverages), raw fruits and raw vegetables, or beans.   If your bowels are filled with gas, your surgeon will have difficulty visualizing your pelvic organs which increases your surgical risks. Blood Transfusion Information WHAT IS A BLOOD TRANSFUSION? A transfusion is the replacement of blood or some of its parts. Blood is made up of multiple cells which provide different functions.  Red blood cells carry oxygen and are  used for blood loss replacement.  White blood cells fight against infection.  Platelets control bleeding.  Plasma helps clot blood.  Other blood products are available for specialized needs, such as hemophilia or other clotting disorders. BEFORE THE TRANSFUSION  Who gives blood for transfusions?   You may be able to donate blood to be used at a later date on yourself (autologous donation).  Relatives can be asked to donate blood. This is generally not any safer than if you have received blood from a stranger. The same precautions are taken to ensure safety when a relative's blood is donated.  Healthy volunteers who are fully evaluated to make sure their blood is safe. This is blood bank blood. Transfusion therapy is the safest it has ever been in the practice of medicine. Before blood is taken from a donor, a complete history is taken to make sure that person has no history of diseases nor engages in risky social behavior (examples are intravenous drug use or sexual activity with multiple partners). The donor's travel history is screened to minimize risk of transmitting infections, such as malaria. The donated blood is tested for signs of infectious diseases, such as HIV and hepatitis. The blood is then tested to be sure it is compatible with you in order to minimize the chance of a transfusion reaction. If you or a relative donates blood, this is often done in anticipation of surgery and is not appropriate for emergency situations. It takes many days to process the donated blood. RISKS AND COMPLICATIONS Although transfusion therapy is very safe and saves many lives, the main dangers of transfusion include:   Getting an infectious disease.  Developing a transfusion reaction. This is an allergic reaction to something in the blood you were given. Every precaution is taken to prevent this. The decision to have a blood transfusion has been considered carefully by your caregiver before blood is  given. Blood is not given unless the benefits outweigh the risks.

## 2018-11-17 NOTE — Progress Notes (Signed)
Consult Note: Gyn-Onc  Consult was requested by Dr. Glo Herring for the evaluation of Natalie Price 28 y.o. female  CC:  Chief Complaint  Patient presents with  . Ovarian cyst, bilateral  . Elevated cancer antigen 125 (CA 125)    Assessment/Plan:  Natalie Price  is a 27 y.o.  year old with bilateral large ovarian cystic masses and elevated CA 125.   I discussed with Velna Hatchet that the differential diagnoses for these include the following most likely diagnoses: 1/ benign ovarian cystic masses such as endometriomas or large functional cysts 2/tubo-ovarian abscesses, 3/hydrosalpinges, 4/bilateral ovarian cancer.  I discussed that the latter is less likely given the mostly cystic appearance of the ovaries and the lack of extraovarian disease appreciated on CT scan.  I feel that the ascites identified on CT scan is likely some cyst rupture and spillage which accounts for some of her pain symptoms.  I discussed with Velna Hatchet that I think that tubo-ovarian abscesses is a less likely diagnosis given her lack of fever or elevated white count.  We would treat this with percutaneous drainage and antibiotics.  However once again I do not think that this is the best course of action at this time.  Instead I am preferring to proceed with surgery for robotic assisted bilateral ovarian cystectomies.  I discussed that we will perform frozen section on the ovarian cysts and if malignant I would recommend staging.  It is possible that we may have to remove an entire tube and ovary but Brandi feel strongly that she would like for Natalie Price to retain fertility and therefore even if malignancy is identified we will retain at least one tube and ovary in her uterus.  If at the time of surgery it is noted that the cystic structures are in fact fallopian tubes rather than ovaries, she is in agreement with proceeding with bilateral salpingectomy given that she understands that spontaneous pregnancy would be unlikely in  the setting of such significantly abnormal fallopian tubes, that fertility is negatively impacted by hydrosalpinges, and that the risk of ectopic pregnancy is high for patients to retain abnormal fallopian tubes.  She understands that if we have to remove both fallopian tubes this will result in permanent sterility and she will require IVF for conception.  She has signed sterilization permission form expressing that she understands that sterilization may need to take place as part of her procedure, however R[place]on to perform this if we need to perform bilateral salpingectomy.  I discussed the potential surgical options and the anticipated route of surgery being a minimally invasive approach with robotic surgical assistance.  I explained anticipated postoperative recovery.  I explained complications including  bleeding, infection, damage to internal organs (such as bladder,ureters, bowels), blood clot, reoperation and rehospitalization. Surgery is scheduled for the second week of July and Velna Hatchet will quarantine for 2 weeks prior to that.   HPI: Natalie Price is a very pleasant 28 year old P0 who is seen in consultation at the request of Dr Glo Herring for bilateral ovarian cysts, ascites and elevated CA 125.  The patient reports a gradual increase in abdominal girth through the springtime of 2020 (after removal of her IUD and attempted pregnancy).  She felt this was just attributed to weight gain.  She been trying to conceive but have been unsuccessful.  In the first week of June 2020 she developed some back pain that was vague.  It became progressively worse until eventually on November 06, 2018 she developed severe abdominal  pains and abdominal distention which necessitated her to visit the emergency department at [facility].  At that time a CT scan of the abdomen and pelvis was performed (on November 01, 2018).  This revealed multiple large bilateral cystic lesions of the ovaries and adnexa measuring up to 9.1 cm on  the left and 8.2 cm on the right.  There was no lymphadenopathy, peritoneal carcinomatosis, but there was a small volume ascites.  The radiologist felt these may be unusually large functional cysts or follicles, and could also represent hydrosalpinx or tubo-ovarian abscesses.  There was submucosal enhancement in the sigmoid colon which was suggestive for colitis.  A transvaginal ultrasound scan was performed in the ED on that same day.  It revealed a uterus measuring 8 x 4.7 x 4.7 cm with no fibroids or other masses.  The endometrium was 4 mm.  There was bilateral adnexal masses.  A mass was seen in the right adnexa measuring 10 x 6.5 x 7.6 cm which could represent a paraovarian mass or large cyst displacing the right ovary.  There was blood flow within the periphery of this largely cystic mass.  In the left adnexa was a 9.9 x 8.8 x 10.1 cm largely cystic mass and a smaller adjacent complex cystic mass which could represent a solid nodule.  There was no blood flow within the nodular component.  There was however blood flow within the periphery of this largely cystic mass.  A small amount of free fluid was seen in the pelvis.  A Ca1 25 was drawn on November 07, 2018 and this was elevated at 581.  Following her emergency room visit her abdominal discomfort subsided significantly.  She still has some mild lower abdominal distention but otherwise no significant pain like the one that had sent her to the emergency department.   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  She is otherwise a very healthy young woman.  She is never had abdominal surgery.  The patient has no history of significantly abnormal Paps (nothing that follow-up surveillance is required).  She has no history of pelvic inflammatory disease, gonorrhea, or chlamydia.  She denies fevers or chills.  She denies abnormal vaginal discharge.  While she was in the emergency  department CBC was drawn which revealed a normal white blood cell count of 10,000.  Platelet count was mildly elevated.    Current Meds:  Outpatient Encounter Medications as of 11/17/2018  Medication Sig  . acetaminophen (TYLENOL) 500 MG tablet Take 1,000 mg by mouth every 6 (six) hours as needed for mild pain or moderate pain.  Marland Kitchen oxyCODONE (ROXICODONE) 5 MG immediate release tablet Take 0.5-1 tablets (2.5-5 mg total) by mouth every 6 (six) hours as needed for severe pain.  . Prenatal Vit-Fe Fumarate-FA (PRENATAL VITAMIN PO) Take 1 tablet by mouth daily.  . promethazine (PHENERGAN) 25 MG tablet Take 0.5-1 tablets (12.5-25 mg total) by mouth every 6 (six) hours as needed for nausea or vomiting.  . naproxen (NAPROSYN) 375 MG tablet Take 1 tablet (375 mg total) by mouth 2 (two) times daily. (Patient not taking: Reported on 11/17/2018)  . traMADol (ULTRAM) 50 MG tablet Take 1 tablet (50 mg total) by mouth every 6 (six) hours as needed for moderate pain or severe pain. (Patient not taking: Reported on 11/17/2018)   No facility-administered encounter medications on file as of 11/17/2018.     Allergy: No Known Allergies  Social Hx:  Social History   Socioeconomic History  . Marital status: Married    Spouse name: Not on file  . Number of children: Not on file  . Years of education: Not on file  . Highest education level: Not on file  Occupational History  . Not on file  Social Needs  . Financial resource strain: Not on file  . Food insecurity    Worry: Not on file    Inability: Not on file  . Transportation needs    Medical: Not on file    Non-medical: Not on file  Tobacco Use  . Smoking status: Never Smoker  . Smokeless tobacco: Never Used  Substance and Sexual Activity  . Alcohol use: No  . Drug use: No  . Sexual activity: Yes  Lifestyle  . Physical activity    Days per week: Not on file    Minutes per session: Not on file  . Stress: Not on file  Relationships  . Social  Herbalist on phone: Not on file    Gets together: Not on file    Attends religious service: Not on file    Active member of club or organization: Not on file    Attends meetings of clubs or organizations: Not on file    Relationship status: Not on file  . Intimate partner violence    Fear of current or ex partner: Not on file    Emotionally abused: Not on file    Physically abused: Not on file    Forced sexual activity: Not on file  Other Topics Concern  . Not on file  Social History Narrative   Starting Surg Tech program at [employer]    Past Surgical Hx:  Past Surgical History:  Procedure Laterality Date  . FRACTURE SURGERY     LEFT ARM  . WISDOM TOOTH EXTRACTION      Past Medical Hx: History reviewed. No pertinent past medical history.  Past Gynecological History:  See HPI Patient's last menstrual period was 10/27/2018 (exact date).  Family Hx:  Family History  Problem Relation Age of Onset  . Breast cancer Paternal Aunt     Review of Systems:  Constitutional  Feels well,  But with some mild lower pelvic discomfort  ENT Normal appearing ears and nares bilaterally Skin/Breast  No rash, sores, jaundice, itching, dryness Cardiovascular  No chest pain, shortness of breath, or edema  Pulmonary  No cough or wheeze.  Gastro Intestinal  No nausea, vomitting, or diarrhoea. No bright red blood per rectum, no abdominal pain, change in bowel movement, or constipation.  Genito Urinary  No frequency, urgency, dysuria, + pelvic pain Musculo Skeletal  No myalgia, arthralgia, joint swelling or pain  Neurologic  No weakness, numbness, change in gait,  Psychology  No depression, anxiety, insomnia.   Vitals:  Blood pressure 132/84, pulse 80, temperature 97.9 F (36.6 C), temperature source Oral, resp. rate 18, height 5\' 4"  (1.626 m), weight 153 lb 1.6 oz (69.4 kg), last menstrual period 10/27/2018, SpO2 100 %, unknown if currently breastfeeding.  Physical Exam: WD in  NAD Neck  Supple NROM, without any enlargements.  Lymph Node Survey No cervical supraclavicular or inguinal adenopathy Cardiovascular  Pulse normal rate, regularity and rhythm. S1 and S2 normal.  Lungs  Clear to auscultation bilateraly, without wheezes/crackles/rhonchi. Good air movement.  Skin  No rash/lesions/breakdown  Psychiatry  Alert and oriented to person, place, and time  Abdomen  Normoactive bowel sounds, abdomen soft, non-tender and nonobese  without evidence of hernia.  Back No CVA tenderness Genito Urinary  Vulva/vagina: Normal external female genitalia.  No lesions. No discharge or bleeding.  Bladder/urethra:  No lesions or masses, well supported bladder  Vagina: normal  Cervix: Normal appearing, no lesions.  Uterus:  Small, mobile, no parametrial involvement or nodularity.  Adnexa: fullness in bilateral adnexa but no discretely nodular masses. Rectal  Good tone, no masses no cul de sac nodularity.  Extremities  No bilateral cyanosis, clubbing or edema.   Thereasa Solo, MD  11/17/2018, 3:05 PM

## 2018-11-17 NOTE — H&P (View-Only) (Signed)
Consult Note: Gyn-Onc  Consult was requested by Dr. Glo Herring for the evaluation of Natalie Price 28 y.o. female  CC:  Chief Complaint  Patient presents with  . Ovarian cyst, bilateral  . Elevated cancer antigen 125 (CA 125)    Assessment/Plan:  Natalie Price  is a 28 y.o.  year old with bilateral large ovarian cystic masses and elevated CA 125.   I discussed with Natalie Price that the differential diagnoses for these include the following most likely diagnoses: 1/ benign ovarian cystic masses such as endometriomas or large functional cysts 2/tubo-ovarian abscesses, 3/hydrosalpinges, 4/bilateral ovarian cancer.  I discussed that the latter is less likely given the mostly cystic appearance of the ovaries and the lack of extraovarian disease appreciated on CT scan.  I feel that the ascites identified on CT scan is likely some cyst rupture and spillage which accounts for some of her pain symptoms.  I discussed with Natalie Price that I think that tubo-ovarian abscesses is a less likely diagnosis given her lack of fever or elevated white count.  We would treat this with percutaneous drainage and antibiotics.  However once again I do not think that this is the best course of action at this time.  Instead I am preferring to proceed with surgery for robotic assisted bilateral ovarian cystectomies.  I discussed that we will perform frozen section on the ovarian cysts and if malignant I would recommend staging.  It is possible that we may have to remove an entire tube and ovary but Brandi feel strongly that she would like for [name] to retain fertility and therefore even if malignancy is identified we will retain at least one tube and ovary in her uterus.  If at the time of surgery it is noted that the cystic structures are in fact fallopian tubes rather than ovaries, she is in agreement with proceeding with bilateral salpingectomy given that she understands that spontaneous pregnancy would be unlikely in  the setting of such significantly abnormal fallopian tubes, that fertility is negatively impacted by hydrosalpinges, and that the risk of ectopic pregnancy is high for patients to retain abnormal fallopian tubes.  She understands that if we have to remove both fallopian tubes this will result in permanent sterility and she will require IVF for conception.  She has signed sterilization permission form expressing that she understands that sterilization may need to take place as part of her procedure, however [place] to perform this if we need to perform bilateral salpingectomy.  I discussed the potential surgical options and the anticipated route of surgery being a minimally invasive approach with robotic surgical assistance.  I explained anticipated postoperative recovery.  I explained complications including  bleeding, infection, damage to internal organs (such as bladder,ureters, bowels), blood clot, reoperation and rehospitalization. Surgery is scheduled for the second week of July and Natalie Price will quarantine for 2 weeks prior to that.   HPI: Natalie Price is a very pleasant 28 year old P0 who is seen in consultation at the request of Dr Glo Herring for bilateral ovarian cysts, ascites and elevated CA 125.  The patient reports a gradual increase in abdominal girth through the springtime of 2020 (after removal of her IUD and attempted pregnancy).  She felt this was just attributed to weight gain.  She been trying to conceive but have been unsuccessful.  In the first week of June 2020 she developed some back pain that was vague.  It became progressively worse until eventually on November 06, 2018 she developed severe abdominal  pains and abdominal distention which necessitated her to visit the emergency department at [facility].  At that time a CT scan of the abdomen and pelvis was performed (on November 01, 2018).  This revealed multiple large bilateral cystic lesions of the ovaries and adnexa measuring up to 9.1 cm on  the left and 8.2 cm on the right.  There was no lymphadenopathy, peritoneal carcinomatosis, but there was a small volume ascites.  The radiologist felt these may be unusually large functional cysts or follicles, and could also represent hydrosalpinx or tubo-ovarian abscesses.  There was submucosal enhancement in the sigmoid colon which was suggestive for colitis.  A transvaginal ultrasound scan was performed in the ED on that same day.  It revealed a uterus measuring 8 x 4.7 x 4.7 cm with no fibroids or other masses.  The endometrium was 4 mm.  There was bilateral adnexal masses.  A mass was seen in the right adnexa measuring 10 x 6.5 x 7.6 cm which could represent a paraovarian mass or large cyst displacing the right ovary.  There was blood flow within the periphery of this largely cystic mass.  In the left adnexa was a 9.9 x 8.8 x 10.1 cm largely cystic mass and a smaller adjacent complex cystic mass which could represent a solid nodule.  There was no blood flow within the nodular component.  There was however blood flow within the periphery of this largely cystic mass.  A small amount of free fluid was seen in the pelvis.  A Ca1 25 was drawn on November 07, 2018 and this was elevated at 581.  Following her emergency room visit her abdominal discomfort subsided significantly.  She still has some mild lower abdominal distention but otherwise no significant pain like the one that had sent her to the emergency department.   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  She is otherwise a very healthy young woman.  She is never had abdominal surgery.  The patient has no history of significantly abnormal Paps (nothing that follow-up surveillance is required).  She has no history of pelvic inflammatory disease, gonorrhea, or chlamydia.  She denies fevers or chills.  She denies abnormal vaginal discharge.  While she was in the emergency  department CBC was drawn which revealed a normal white blood cell count of 10,000.  Platelet count was mildly elevated.    Current Meds:  Outpatient Encounter Medications as of 11/17/2018  Medication Sig  . acetaminophen (TYLENOL) 500 MG tablet Take 1,000 mg by mouth every 6 (six) hours as needed for mild pain or moderate pain.  Marland Kitchen oxyCODONE (ROXICODONE) 5 MG immediate release tablet Take 0.5-1 tablets (2.5-5 mg total) by mouth every 6 (six) hours as needed for severe pain.  . Prenatal Vit-Fe Fumarate-FA (PRENATAL VITAMIN PO) Take 1 tablet by mouth daily.  . promethazine (PHENERGAN) 25 MG tablet Take 0.5-1 tablets (12.5-25 mg total) by mouth every 6 (six) hours as needed for nausea or vomiting.  . naproxen (NAPROSYN) 375 MG tablet Take 1 tablet (375 mg total) by mouth 2 (two) times daily. (Patient not taking: Reported on 11/17/2018)  . traMADol (ULTRAM) 50 MG tablet Take 1 tablet (50 mg total) by mouth every 6 (six) hours as needed for moderate pain or severe pain. (Patient not taking: Reported on 11/17/2018)   No facility-administered encounter medications on file as of 11/17/2018.     Allergy: No Known Allergies  Social Hx:  Social History   Socioeconomic History  . Marital status: Married    Spouse name: Not on file  . Number of children: Not on file  . Years of education: Not on file  . Highest education level: Not on file  Occupational History  . Not on file  Social Needs  . Financial resource strain: Not on file  . Food insecurity    Worry: Not on file    Inability: Not on file  . Transportation needs    Medical: Not on file    Non-medical: Not on file  Tobacco Use  . Smoking status: Never Smoker  . Smokeless tobacco: Never Used  Substance and Sexual Activity  . Alcohol use: No  . Drug use: No  . Sexual activity: Yes  Lifestyle  . Physical activity    Days per week: Not on file    Minutes per session: Not on file  . Stress: Not on file  Relationships  . Social  Herbalist on phone: Not on file    Gets together: Not on file    Attends religious service: Not on file    Active member of club or organization: Not on file    Attends meetings of clubs or organizations: Not on file    Relationship status: Not on file  . Intimate partner violence    Fear of current or ex partner: Not on file    Emotionally abused: Not on file    Physically abused: Not on file    Forced sexual activity: Not on file  Other Topics Concern  . Not on file  Social History Narrative   Starting Surg Tech program at [employer]    Past Surgical Hx:  Past Surgical History:  Procedure Laterality Date  . FRACTURE SURGERY     LEFT ARM  . WISDOM TOOTH EXTRACTION      Past Medical Hx: History reviewed. No pertinent past medical history.  Past Gynecological History:  See HPI Patient's last menstrual period was 10/27/2018 (exact date).  Family Hx:  Family History  Problem Relation Age of Onset  . Breast cancer Paternal Aunt     Review of Systems:  Constitutional  Feels well,  But with some mild lower pelvic discomfort  ENT Normal appearing ears and nares bilaterally Skin/Breast  No rash, sores, jaundice, itching, dryness Cardiovascular  No chest pain, shortness of breath, or edema  Pulmonary  No cough or wheeze.  Gastro Intestinal  No nausea, vomitting, or diarrhoea. No bright red blood per rectum, no abdominal pain, change in bowel movement, or constipation.  Genito Urinary  No frequency, urgency, dysuria, + pelvic pain Musculo Skeletal  No myalgia, arthralgia, joint swelling or pain  Neurologic  No weakness, numbness, change in gait,  Psychology  No depression, anxiety, insomnia.   Vitals:  Blood pressure 132/84, pulse 80, temperature 97.9 F (36.6 C), temperature source Oral, resp. rate 18, height 5\' 4"  (1.626 m), weight 153 lb 1.6 oz (69.4 kg), last menstrual period 10/27/2018, SpO2 100 %, unknown if currently breastfeeding.  Physical Exam: WD in  NAD Neck  Supple NROM, without any enlargements.  Lymph Node Survey No cervical supraclavicular or inguinal adenopathy Cardiovascular  Pulse normal rate, regularity and rhythm. S1 and S2 normal.  Lungs  Clear to auscultation bilateraly, without wheezes/crackles/rhonchi. Good air movement.  Skin  No rash/lesions/breakdown  Psychiatry  Alert and oriented to person, place, and time  Abdomen  Normoactive bowel sounds, abdomen soft, non-tender and nonobese  without evidence of hernia.  Back No CVA tenderness Genito Urinary  Vulva/vagina: Normal external female genitalia.  No lesions. No discharge or bleeding.  Bladder/urethra:  No lesions or masses, well supported bladder  Vagina: normal  Cervix: Normal appearing, no lesions.  Uterus:  Small, mobile, no parametrial involvement or nodularity.  Adnexa: fullness in bilateral adnexa but no discretely nodular masses. Rectal  Good tone, no masses no cul de sac nodularity.  Extremities  No bilateral cyanosis, clubbing or edema.   Thereasa Solo, MD  11/17/2018, 3:05 PM

## 2018-11-22 ENCOUNTER — Other Ambulatory Visit: Payer: Self-pay | Admitting: Gynecologic Oncology

## 2018-11-22 DIAGNOSIS — N83201 Unspecified ovarian cyst, right side: Secondary | ICD-10-CM

## 2018-11-22 DIAGNOSIS — N83202 Unspecified ovarian cyst, left side: Secondary | ICD-10-CM

## 2018-11-29 NOTE — Patient Instructions (Addendum)
YOU HAD   A COVID 19 TEST ON 12-01-2018 115 PM,  ONCE YOUR COVID TEST IS COMPLETED, PLEASE BEGIN THE QUARANTINE INSTRUCTIONS AS OUTLINED IN YOUR HANDOUT.                Natalie Price    Your procedure is scheduled on: 12-05-2018   Report to [facility] Main  Entrance    Report to Admitting at  11:00 AM     Call this number if you have problems the morning of surgery [phone]    Remember: Do not eat food  :After Midnight.      Eat a light diet the day before surgery.  Examples including soups, broths, toast, yogurt, mashed potatoes.  Things to avoid include carbonated beverages (fizzy beverages), raw fruits and raw vegetables, or beans.   If your bowels are filled with gas, your surgeon will have difficulty visualizing your pelvic organs which increases your surgical risks.  CLEAR LIQUIDS FROM MIDNIGHT UNTIL 1130 AM. THEN NOTHING BY MOUTH AFTER 1130 AM.   CLEAR LIQUID DIET   Foods Allowed                                                                     Foods Excluded  Coffee and tea, regular and decaf                             liquids that you cannot  Plain Jell-O in any flavor                                             see through such as: Fruit ices (not with fruit pulp)                                     milk, soups, orange juice  Iced Popsicles                                    All solid food                                   Cranberry, grape and apple juices Sports drinks like Gatorade Lightly seasoned clear broth or consume(fat free) Sugar, honey syrup  Sample Menu Breakfast                                Lunch                                     Supper Cranberry juice                    Beef broth  Chicken broth Jell-O                                     Grape juice                           Apple juice Coffee or tea                        Jell-O                                      Popsicle                     Coffee or tea                        Coffee or tea  _____________________________________________________________________     Take these medicines the morning of surgery with A SIP OF WATER: None  BRUSH YOUR TEETH MORNING OF SURGERY AND RINSE YOUR MOUTH OUT, NO CHEWING GUM CANDY OR MINTS.                               You may not have any metal on your body including hair pins and              piercings     Do not wear jewelry, make-up, lotions, powders or perfumes, deodorant               Do not wear nail polish.  Do not shave  48 hours prior to surgery.               Do not bring valuables to the hospital. [place].  Contacts, dentures or bridgework may not be worn into surgery.      Patients discharged the day of surgery will not be allowed to drive home. IF YOU ARE HAVING SURGERY AND GOING HOME THE SAME DAY, YOU MUST HAVE AN ADULT TO DRIVE YOU HOME AND BE WITH YOU FOR 24 HOURS. YOU MAY GO HOME BY TAXI OR UBER OR ORTHERWISE, BUT AN ADULT MUST ACCOMPANY YOU HOME AND STAY WITH YOU FOR 24 HOURS.  Name and phone number of your driver: Natalie Price [phone]  Special Instructions: N/A              Please read over the following fact sheets you were given: _____________________________________________________________________             [facility] - Preparing for Surgery Before surgery, you can play an important role.  Because skin is not sterile, your skin needs to be as free of germs as possible.  You can reduce the number of germs on your skin by washing with CHG (chlorahexidine gluconate) soap before surgery.  CHG is an antiseptic cleaner which kills germs and bonds with the skin to continue killing germs even after washing. Please DO NOT use if you have an allergy to CHG or antibacterial soaps.  If your skin becomes reddened/irritated stop using the CHG and inform your nurse when you arrive at Short  Stay.  Do not shave (including legs and underarms) for at least 48 hours prior to the first CHG shower.  You may shave your face/neck. Please follow these instructions carefully:  1.  Shower with CHG Soap the night before surgery and the  morning of Surgery.  2.  If you choose to wash your hair, wash your hair first as usual with your  normal  shampoo.  3.  After you shampoo, rinse your hair and body thoroughly to remove the  shampoo.                           4.  Use CHG as you would any other liquid soap.  You can apply chg directly  to the skin and wash                       Gently with a scrungie or clean washcloth.  5.  Apply the CHG Soap to your body ONLY FROM THE NECK DOWN.   Do not use on face/ open                           Wound or open sores. Avoid contact with eyes, ears mouth and genitals (private parts).                       Wash face,  Genitals (private parts) with your normal soap.             6.  Wash thoroughly, paying special attention to the area where your surgery  will be performed.  7.  Thoroughly rinse your body with warm water from the neck down.  8.  DO NOT shower/wash with your normal soap after using and rinsing off  the CHG Soap.                9.  Pat yourself dry with a clean towel.            10.  Wear clean pajamas.            11.  Place clean sheets on your bed the night of your first shower and do not  sleep with pets. Day of Surgery : Do not apply any lotions/deodorants the morning of surgery.  Please wear clean clothes to the hospital/surgery center.  FAILURE TO FOLLOW THESE INSTRUCTIONS MAY RESULT IN THE CANCELLATION OF YOUR SURGERY PATIENT SIGNATURE_________________________________  NURSE SIGNATURE__________________________________  ________________________________________________________________________   Natalie Price  An incentive spirometer is a tool that can help keep your lungs clear and active. This tool measures how well you are  filling your lungs with each breath. Taking long deep breaths may help reverse or decrease the chance of developing breathing (pulmonary) problems (especially infection) following:  A long period of time when you are unable to move or be active. BEFORE THE PROCEDURE   If the spirometer includes an indicator to show your best effort, your nurse or respiratory therapist will set it to a desired goal.  If possible, sit up straight or lean slightly forward. Try not to slouch.  Hold the incentive spirometer in an upright position. INSTRUCTIONS FOR USE  1. Sit on the edge of your bed if possible, or sit up as far as you can in bed or on a chair. 2. Hold the incentive spirometer in an upright position. 3. Breathe out normally. 4. Place the mouthpiece in  your mouth and seal your lips tightly around it. 5. Breathe in slowly and as deeply as possible, raising the piston or the ball toward the top of the column. 6. Hold your breath for 3-5 seconds or for as long as possible. Allow the piston or ball to fall to the bottom of the column. 7. Remove the mouthpiece from your mouth and breathe out normally. 8. Rest for a few seconds and repeat Steps 1 through 7 at least 10 times every 1-2 hours when you are awake. Take your time and take a few normal breaths between deep breaths. 9. The spirometer may include an indicator to show your best effort. Use the indicator as a goal to work toward during each repetition. 10. After each set of 10 deep breaths, practice coughing to be sure your lungs are clear. If you have an incision (the cut made at the time of surgery), support your incision when coughing by placing a pillow or rolled up towels firmly against it. Once you are able to get out of bed, walk around indoors and cough well. You may stop using the incentive spirometer when instructed by your caregiver.  RISKS AND COMPLICATIONS  Take your time so you do not get dizzy or light-headed.  If you are in pain,  you may need to take or ask for pain medication before doing incentive spirometry. It is harder to take a deep breath if you are having pain. AFTER USE  Rest and breathe slowly and easily.  It can be helpful to keep track of a log of your progress. Your caregiver can provide you with a simple table to help with this. If you are using the spirometer at home, follow these instructions: [place] IF:   You are having difficultly using the spirometer.  You have trouble using the spirometer as often as instructed.  Your pain medication is not giving enough relief while using the spirometer.  You develop fever of 100.5 F (38.1 C) or higher. SEEK IMMEDIATE MEDICAL CARE IF:   You cough up bloody sputum that had not been present before.  You develop fever of 102 F (38.9 C) or greater.  You develop worsening pain at or near the incision site. MAKE SURE YOU:   Understand these instructions.  Will watch your condition.  Will get help right away if you are not doing well or get worse. Document Released: 09/20/2006 Document Revised: 08/02/2011 Document Reviewed: 11/21/2006 ExitCare Patient Information 2014 ExitCare, [place].   ________________________________________________________________________  WHAT IS A BLOOD TRANSFUSION? Blood Transfusion Information  A transfusion is the replacement of blood or some of its parts. Blood is made up of multiple cells which provide different functions.  Red blood cells carry oxygen and are used for blood loss replacement.  White blood cells fight against infection.  Platelets control bleeding.  Plasma helps clot blood.  Other blood products are available for specialized needs, such as hemophilia or other clotting disorders. BEFORE THE TRANSFUSION  Who gives blood for transfusions?   Healthy volunteers who are fully evaluated to make sure their blood is safe. This is blood bank blood. Transfusion therapy is the safest it has ever  been in the practice of medicine. Before blood is taken from a donor, a complete history is taken to make sure that person has no history of diseases nor engages in risky social behavior (examples are intravenous drug use or sexual activity with multiple partners). The donor's travel history is screened to minimize risk  of transmitting infections, such as malaria. The donated blood is tested for signs of infectious diseases, such as HIV and hepatitis. The blood is then tested to be sure it is compatible with you in order to minimize the chance of a transfusion reaction. If you or a relative donates blood, this is often done in anticipation of surgery and is not appropriate for emergency situations. It takes many days to process the donated blood. RISKS AND COMPLICATIONS Although transfusion therapy is very safe and saves many lives, the main dangers of transfusion include:   Getting an infectious disease.  Developing a transfusion reaction. This is an allergic reaction to something in the blood you were given. Every precaution is taken to prevent this. The decision to have a blood transfusion has been considered carefully by your caregiver before blood is given. Blood is not given unless the benefits outweigh the risks. AFTER THE TRANSFUSION  Right after receiving a blood transfusion, you will usually feel much better and more energetic. This is especially true if your red blood cells have gotten low (anemic). The transfusion raises the level of the red blood cells which carry oxygen, and this usually causes an energy increase.  The nurse administering the transfusion will monitor you carefully for complications. HOME CARE INSTRUCTIONS  No special instructions are needed after a transfusion. You may find your energy is better. Speak with your caregiver about any limitations on activity for underlying diseases you may have. SEEK MEDICAL CARE IF:   Your condition is not improving after your  transfusion.  You develop redness or irritation at the intravenous (IV) site. SEEK IMMEDIATE MEDICAL CARE IF:  Any of the following symptoms occur over the next 12 hours:  Shaking chills.  You have a temperature by mouth above 102 F (38.9 C), not controlled by medicine.  Chest, back, or muscle pain.  People around you feel you are not acting correctly or are confused.  Shortness of breath or difficulty breathing.  Dizziness and fainting.  You get a rash or develop hives.  You have a decrease in urine output.  Your urine turns a dark color or changes to pink, red, or brown. Any of the following symptoms occur over the next 10 days:  You have a temperature by mouth above 102 F (38.9 C), not controlled by medicine.  Shortness of breath.  Weakness after normal activity.  The white part of the eye turns yellow (jaundice).  You have a decrease in the amount of urine or are urinating less often.  Your urine turns a dark color or changes to pink, red, or brown. Document Released: 05/07/2000 Document Revised: 08/02/2011 Document Reviewed: 12/25/2007 [facility] Patient Information 2014 [place], [place].  _______________________________________________________________________

## 2018-11-30 ENCOUNTER — Inpatient Hospital Stay (HOSPITAL_COMMUNITY): Admission: RE | Admit: 2018-11-30 | Payer: BC Managed Care – PPO | Source: Ambulatory Visit

## 2018-12-01 ENCOUNTER — Other Ambulatory Visit (HOSPITAL_COMMUNITY)
Admission: RE | Admit: 2018-12-01 | Discharge: 2018-12-01 | Disposition: A | Discharge status: Home / Self Care | Payer: BC Managed Care – PPO | Source: Ambulatory Visit | Attending: Gynecologic Oncology | Admitting: Gynecologic Oncology

## 2018-12-01 ENCOUNTER — Other Ambulatory Visit: Payer: Self-pay

## 2018-12-01 ENCOUNTER — Encounter (HOSPITAL_COMMUNITY)
Admission: RE | Admit: 2018-12-01 | Discharge: 2018-12-01 | Disposition: A | Discharge status: Home / Self Care | Payer: BC Managed Care – PPO | Source: Ambulatory Visit | Attending: Gynecologic Oncology | Admitting: Gynecologic Oncology

## 2018-12-01 ENCOUNTER — Encounter (HOSPITAL_COMMUNITY): Payer: Self-pay

## 2018-12-01 DIAGNOSIS — R971 Elevated cancer antigen 125 [CA 125]: Secondary | ICD-10-CM | POA: Diagnosis not present

## 2018-12-01 DIAGNOSIS — N83201 Unspecified ovarian cyst, right side: Secondary | ICD-10-CM | POA: Diagnosis not present

## 2018-12-01 DIAGNOSIS — Z01812 Encounter for preprocedural laboratory examination: Secondary | ICD-10-CM | POA: Diagnosis not present

## 2018-12-01 DIAGNOSIS — Z1159 Encounter for screening for other viral diseases: Secondary | ICD-10-CM | POA: Insufficient documentation

## 2018-12-01 DIAGNOSIS — N83202 Unspecified ovarian cyst, left side: Secondary | ICD-10-CM | POA: Diagnosis not present

## 2018-12-01 LAB — URINALYSIS, ROUTINE W REFLEX MICROSCOPIC
Bacteria, UA: NONE SEEN
Bilirubin Urine: NEGATIVE
Glucose, UA: NEGATIVE mg/dL
Hgb urine dipstick: NEGATIVE
Ketones, ur: NEGATIVE mg/dL
Leukocytes,Ua: NEGATIVE
Nitrite: NEGATIVE
Protein, ur: NEGATIVE mg/dL
Specific Gravity, Urine: 1.023 (ref 1.005–1.030)
pH: 6 (ref 5.0–8.0)

## 2018-12-01 LAB — CBC
HCT: 40.2 % (ref 36.0–46.0)
Hemoglobin: 12.8 g/dL (ref 12.0–15.0)
MCH: 32.1 pg (ref 26.0–34.0)
MCHC: 31.8 g/dL (ref 30.0–36.0)
MCV: 100.8 fL — ABNORMAL HIGH (ref 80.0–100.0)
Platelets: 291 10*3/uL (ref 150–400)
RBC: 3.99 MIL/uL (ref 3.87–5.11)
RDW: 11.8 % (ref 11.5–15.5)
WBC: 7.1 10*3/uL (ref 4.0–10.5)
nRBC: 0 % (ref 0.0–0.2)

## 2018-12-01 LAB — COMPREHENSIVE METABOLIC PANEL
ALT: 14 U/L (ref 0–44)
AST: 16 U/L (ref 15–41)
Albumin: 4.4 g/dL (ref 3.5–5.0)
Alkaline Phosphatase: 58 U/L (ref 38–126)
Anion gap: 9 (ref 5–15)
BUN: 12 mg/dL (ref 6–20)
CO2: 27 mmol/L (ref 22–32)
Calcium: 9.2 mg/dL (ref 8.9–10.3)
Chloride: 103 mmol/L (ref 98–111)
Creatinine, Ser: 0.55 mg/dL (ref 0.44–1.00)
GFR calc Af Amer: >60 mL/min (ref >60)
GFR calc non Af Amer: >60 mL/min (ref >60)
Glucose, Bld: 95 mg/dL (ref 70–99)
Potassium: 3.5 mmol/L (ref 3.5–5.1)
Sodium: 139 mmol/L (ref 135–145)
Total Bilirubin: 0.3 mg/dL (ref 0.3–1.2)
Total Protein: 7.4 g/dL (ref 6.5–8.1)

## 2018-12-01 LAB — SARS CORONAVIRUS 2 (TAT 6-24 HRS): SARS Coronavirus 2: NEGATIVE

## 2018-12-03 LAB — ABO/RH: ABO/RH(D): A POS

## 2018-12-04 ENCOUNTER — Other Ambulatory Visit: Payer: Self-pay | Admitting: Gynecologic Oncology

## 2018-12-04 ENCOUNTER — Telehealth: Payer: Self-pay | Admitting: Gynecologic Oncology

## 2018-12-04 ENCOUNTER — Telehealth: Payer: Self-pay

## 2018-12-04 DIAGNOSIS — N83202 Unspecified ovarian cyst, left side: Secondary | ICD-10-CM

## 2018-12-04 DIAGNOSIS — N83201 Unspecified ovarian cyst, right side: Secondary | ICD-10-CM

## 2018-12-04 MED ORDER — OXYCODONE HCL 5 MG PO TABS: 5.0000 mg | ORAL_TABLET | ORAL | 0 refills | Status: DC | PRN

## 2018-12-04 MED ORDER — SENNOSIDES-DOCUSATE SODIUM 8.6-50 MG PO TABS: 2.0000 | ORAL_TABLET | Freq: Every day | ORAL | 0 refills | Status: DC

## 2018-12-04 MED ORDER — IBUPROFEN 600 MG PO TABS: 600.0000 mg | ORAL_TABLET | Freq: Four times a day (QID) | ORAL | 0 refills | Status: DC | PRN

## 2018-12-04 NOTE — Progress Notes (Signed)
See RN note.  Patient returned call.  Informed of post-op restrictions from RN.

## 2018-12-04 NOTE — Telephone Encounter (Signed)
Spoke with Ms Argabright and verified pharmacy where Oxy IR, Ibuprofen, and senokot-S can be sent in by Joylene John, NP. She can have sugar in coffee tomorrow per Joylene John, NP. She is not to lift > 10 lbs after surgery as well as no driving for a week.  She can shower after surgery. Pt to call if there are any questions or concerns prior to surgery tomorrow.

## 2018-12-04 NOTE — Telephone Encounter (Signed)
Called patient to see if she had any questions pre-op and to discuss post-op instructions.  Left message asking her to please call the office.

## 2018-12-05 ENCOUNTER — Ambulatory Visit (HOSPITAL_COMMUNITY): Payer: BC Managed Care – PPO | Admitting: Physician Assistant

## 2018-12-05 ENCOUNTER — Encounter (HOSPITAL_COMMUNITY)
Admission: RE | Disposition: A | Discharge status: Home / Self Care | Payer: Self-pay | Source: Home / Self Care | Attending: Gynecologic Oncology

## 2018-12-05 ENCOUNTER — Ambulatory Visit (HOSPITAL_COMMUNITY)
Admission: RE | Admit: 2018-12-05 | Discharge: 2018-12-05 | Disposition: A | Discharge status: Home / Self Care | Payer: BC Managed Care – PPO | Attending: Gynecologic Oncology | Admitting: Gynecologic Oncology

## 2018-12-05 ENCOUNTER — Ambulatory Visit (HOSPITAL_COMMUNITY): Payer: BC Managed Care – PPO | Admitting: Anesthesiology

## 2018-12-05 ENCOUNTER — Encounter (HOSPITAL_COMMUNITY): Payer: Self-pay | Admitting: *Deleted

## 2018-12-05 DIAGNOSIS — N83202 Unspecified ovarian cyst, left side: Secondary | ICD-10-CM

## 2018-12-05 DIAGNOSIS — N80109 Endometriosis of ovary, unspecified side, unspecified depth: Secondary | ICD-10-CM

## 2018-12-05 DIAGNOSIS — N800 Endometriosis of uterus: Secondary | ICD-10-CM

## 2018-12-05 DIAGNOSIS — N809 Endometriosis, unspecified: Secondary | ICD-10-CM | POA: Diagnosis not present

## 2018-12-05 DIAGNOSIS — R971 Elevated cancer antigen 125 [CA 125]: Secondary | ICD-10-CM | POA: Diagnosis not present

## 2018-12-05 DIAGNOSIS — N804 Endometriosis of rectovaginal septum, unspecified involvement of vagina: Secondary | ICD-10-CM

## 2018-12-05 DIAGNOSIS — N83201 Unspecified ovarian cyst, right side: Secondary | ICD-10-CM | POA: Diagnosis not present

## 2018-12-05 DIAGNOSIS — R188 Other ascites: Secondary | ICD-10-CM

## 2018-12-05 DIAGNOSIS — N801 Endometriosis of ovary: Secondary | ICD-10-CM | POA: Diagnosis not present

## 2018-12-05 HISTORY — PX: ROBOTIC ASSISTED LAPAROSCOPIC OVARIAN CYSTECTOMY: SHX6081

## 2018-12-05 LAB — TYPE AND SCREEN
ABO/RH(D): A POS
Antibody Screen: NEGATIVE

## 2018-12-05 LAB — PREGNANCY, URINE: Preg Test, Ur: NEGATIVE

## 2018-12-05 SURGERY — EXCISION, CYST, OVARY, ROBOT-ASSISTED, LAPAROSCOPIC
Anesthesia: General | Laterality: Bilateral

## 2018-12-05 MED ORDER — HYDROMORPHONE HCL 1 MG/ML IJ SOLN
INTRAMUSCULAR | Status: AC
  Filled 2018-12-05: qty 1

## 2018-12-05 MED ORDER — SODIUM CHLORIDE 0.9 % IV SOLN: 250.0000 mL | INTRAVENOUS | Status: DC | PRN

## 2018-12-05 MED ORDER — MIDAZOLAM HCL 2 MG/2ML IJ SOLN
INTRAMUSCULAR | Status: AC
  Filled 2018-12-05: qty 2

## 2018-12-05 MED ORDER — LACTATED RINGERS IV SOLN
INTRAVENOUS | Status: DC
  Administered 2018-12-05 (×2): via INTRAVENOUS

## 2018-12-05 MED ORDER — OXYCODONE HCL 5 MG PO TABS
ORAL_TABLET | ORAL | Status: AC
  Filled 2018-12-05: qty 1

## 2018-12-05 MED ORDER — HEMOSTATIC AGENTS (NO CHARGE) OPTIME
TOPICAL | Status: DC | PRN
  Administered 2018-12-05: 3 via TOPICAL

## 2018-12-05 MED ORDER — MIDAZOLAM HCL 5 MG/5ML IJ SOLN
INTRAMUSCULAR | Status: DC | PRN
  Administered 2018-12-05: 2 mg via INTRAVENOUS

## 2018-12-05 MED ORDER — FENTANYL CITRATE (PF) 250 MCG/5ML IJ SOLN
INTRAMUSCULAR | Status: AC
  Filled 2018-12-05: qty 5

## 2018-12-05 MED ORDER — PROPOFOL 10 MG/ML IV BOLUS
INTRAVENOUS | Status: DC | PRN
  Administered 2018-12-05: 150 mg via INTRAVENOUS
  Administered 2018-12-05: 30 mg via INTRAVENOUS

## 2018-12-05 MED ORDER — PROMETHAZINE HCL 25 MG/ML IJ SOLN: 6.2500 mg | INTRAMUSCULAR | Status: DC | PRN

## 2018-12-05 MED ORDER — ACETAMINOPHEN 10 MG/ML IV SOLN
1000.0000 mg | Freq: Once | INTRAVENOUS | Status: DC | PRN
  Administered 2018-12-05: 1000 mg via INTRAVENOUS

## 2018-12-05 MED ORDER — STERILE WATER FOR IRRIGATION IR SOLN
Status: DC | PRN
  Administered 2018-12-05: 1000 mL

## 2018-12-05 MED ORDER — ACETAMINOPHEN 500 MG PO TABS
1000.0000 mg | ORAL_TABLET | ORAL | Status: AC
  Administered 2018-12-05: 1000 mg via ORAL
  Filled 2018-12-05: qty 2

## 2018-12-05 MED ORDER — CEFAZOLIN SODIUM-DEXTROSE 2-4 GM/100ML-% IV SOLN
2.0000 g | INTRAVENOUS | Status: AC
  Administered 2018-12-05: 2 g via INTRAVENOUS
  Filled 2018-12-05: qty 100

## 2018-12-05 MED ORDER — ROCURONIUM BROMIDE 10 MG/ML (PF) SYRINGE
PREFILLED_SYRINGE | INTRAVENOUS | Status: DC | PRN
  Administered 2018-12-05: 60 mg via INTRAVENOUS
  Administered 2018-12-05: 20 mg via INTRAVENOUS

## 2018-12-05 MED ORDER — LIDOCAINE 2% (20 MG/ML) 5 ML SYRINGE
INTRAMUSCULAR | Status: DC | PRN
  Administered 2018-12-05: 80 mg via INTRAVENOUS

## 2018-12-05 MED ORDER — BUPIVACAINE HCL (PF) 0.25 % IJ SOLN
INTRAMUSCULAR | Status: DC | PRN
  Administered 2018-12-05: 17 mL

## 2018-12-05 MED ORDER — KETOROLAC TROMETHAMINE 30 MG/ML IJ SOLN
INTRAMUSCULAR | Status: AC
  Filled 2018-12-05: qty 1

## 2018-12-05 MED ORDER — PROPOFOL 500 MG/50ML IV EMUL
INTRAVENOUS | Status: DC | PRN
  Administered 2018-12-05: 25 ug/kg/min via INTRAVENOUS

## 2018-12-05 MED ORDER — BUPIVACAINE HCL (PF) 0.25 % IJ SOLN
INTRAMUSCULAR | Status: AC
  Filled 2018-12-05: qty 30

## 2018-12-05 MED ORDER — ROCURONIUM BROMIDE 10 MG/ML (PF) SYRINGE
PREFILLED_SYRINGE | INTRAVENOUS | Status: AC
  Filled 2018-12-05: qty 10

## 2018-12-05 MED ORDER — ACETAMINOPHEN 325 MG PO TABS: 650.0000 mg | ORAL_TABLET | ORAL | Status: DC | PRN

## 2018-12-05 MED ORDER — PROPOFOL 10 MG/ML IV BOLUS
INTRAVENOUS | Status: AC
  Filled 2018-12-05: qty 20

## 2018-12-05 MED ORDER — DEXAMETHASONE SODIUM PHOSPHATE 4 MG/ML IJ SOLN: 4.0000 mg | INTRAMUSCULAR | Status: DC

## 2018-12-05 MED ORDER — ONDANSETRON HCL 4 MG/2ML IJ SOLN
INTRAMUSCULAR | Status: AC
  Filled 2018-12-05: qty 2

## 2018-12-05 MED ORDER — OXYCODONE HCL 5 MG/5ML PO SOLN: 5.0000 mg | Freq: Once | ORAL | Status: AC | PRN

## 2018-12-05 MED ORDER — ACETAMINOPHEN 650 MG RE SUPP
650.0000 mg | RECTAL | Status: DC | PRN
  Filled 2018-12-05: qty 1

## 2018-12-05 MED ORDER — DEXAMETHASONE SODIUM PHOSPHATE 10 MG/ML IJ SOLN
INTRAMUSCULAR | Status: DC | PRN
  Administered 2018-12-05: 4 mg via INTRAVENOUS

## 2018-12-05 MED ORDER — PROPOFOL 10 MG/ML IV BOLUS
INTRAVENOUS | Status: AC
  Filled 2018-12-05: qty 40

## 2018-12-05 MED ORDER — ACETAMINOPHEN 10 MG/ML IV SOLN
INTRAVENOUS | Status: AC
  Filled 2018-12-05: qty 100

## 2018-12-05 MED ORDER — SUGAMMADEX SODIUM 200 MG/2ML IV SOLN
INTRAVENOUS | Status: DC | PRN
  Administered 2018-12-05: 200 mg via INTRAVENOUS

## 2018-12-05 MED ORDER — ONDANSETRON HCL 4 MG/2ML IJ SOLN
INTRAMUSCULAR | Status: DC | PRN
  Administered 2018-12-05: 4 mg via INTRAVENOUS

## 2018-12-05 MED ORDER — KETOROLAC TROMETHAMINE 30 MG/ML IJ SOLN
30.0000 mg | Freq: Four times a day (QID) | INTRAMUSCULAR | Status: DC
  Administered 2018-12-05: 17:00:00 30 mg via INTRAVENOUS

## 2018-12-05 MED ORDER — LACTATED RINGERS IR SOLN
Status: DC | PRN
  Administered 2018-12-05: 1

## 2018-12-05 MED ORDER — SODIUM CHLORIDE 0.9% FLUSH: 3.0000 mL | INTRAVENOUS | Status: DC | PRN

## 2018-12-05 MED ORDER — CELECOXIB 200 MG PO CAPS
400.0000 mg | ORAL_CAPSULE | ORAL | Status: AC
  Administered 2018-12-05: 400 mg via ORAL
  Filled 2018-12-05: qty 2

## 2018-12-05 MED ORDER — GABAPENTIN 300 MG PO CAPS
300.0000 mg | ORAL_CAPSULE | ORAL | Status: AC
  Administered 2018-12-05: 300 mg via ORAL
  Filled 2018-12-05: qty 1

## 2018-12-05 MED ORDER — OXYCODONE HCL 5 MG PO TABS: 5.0000 mg | ORAL_TABLET | ORAL | Status: DC | PRN

## 2018-12-05 MED ORDER — LIDOCAINE 2% (20 MG/ML) 5 ML SYRINGE
INTRAMUSCULAR | Status: AC
  Filled 2018-12-05: qty 5

## 2018-12-05 MED ORDER — SCOPOLAMINE 1 MG/3DAYS TD PT72
1.0000 | MEDICATED_PATCH | TRANSDERMAL | Status: DC
  Administered 2018-12-05: 11:00:00 1.5 mg via TRANSDERMAL
  Filled 2018-12-05: qty 1

## 2018-12-05 MED ORDER — FENTANYL CITRATE (PF) 250 MCG/5ML IJ SOLN
INTRAMUSCULAR | Status: DC | PRN
  Administered 2018-12-05: 50 ug via INTRAVENOUS
  Administered 2018-12-05: 100 ug via INTRAVENOUS
  Administered 2018-12-05 (×2): 50 ug via INTRAVENOUS

## 2018-12-05 MED ORDER — SUGAMMADEX SODIUM 500 MG/5ML IV SOLN
INTRAVENOUS | Status: AC
  Filled 2018-12-05: qty 5

## 2018-12-05 MED ORDER — SUCCINYLCHOLINE CHLORIDE 200 MG/10ML IV SOSY
PREFILLED_SYRINGE | INTRAVENOUS | Status: AC
  Filled 2018-12-05: qty 10

## 2018-12-05 MED ORDER — OXYCODONE HCL 5 MG PO TABS
5.0000 mg | ORAL_TABLET | Freq: Once | ORAL | Status: AC | PRN
  Administered 2018-12-05: 5 mg via ORAL

## 2018-12-05 MED ORDER — HYDROMORPHONE HCL 1 MG/ML IJ SOLN
0.2500 mg | INTRAMUSCULAR | Status: DC | PRN
  Administered 2018-12-05 (×5): 0.5 mg via INTRAVENOUS

## 2018-12-05 MED ORDER — SODIUM CHLORIDE 0.9% FLUSH: 3.0000 mL | Freq: Two times a day (BID) | INTRAVENOUS | Status: DC

## 2018-12-05 SURGICAL SUPPLY — 57 items
APPLICATOR SURGIFLO ENDO (HEMOSTASIS) ×1 IMPLANT
BAG LAPAROSCOPIC 12 15 PORT 16 (BASKET) IMPLANT
BAG RETRIEVAL 12/15 (BASKET)
COVER BACK TABLE 60X90IN (DRAPES) ×2 IMPLANT
COVER TIP SHEARS 8 DVNC (MISCELLANEOUS) ×1 IMPLANT
COVER TIP SHEARS 8MM DA VINCI (MISCELLANEOUS) ×1
COVER WAND RF STERILE (DRAPES) IMPLANT
DECANTER SPIKE VIAL GLASS SM (MISCELLANEOUS) IMPLANT
DERMABOND ADVANCED (GAUZE/BANDAGES/DRESSINGS) ×1
DERMABOND ADVANCED .7 DNX12 (GAUZE/BANDAGES/DRESSINGS) ×1 IMPLANT
DRAIN CHANNEL RND F F (WOUND CARE) IMPLANT
DRAPE ARM DVNC X/XI (DISPOSABLE) ×4 IMPLANT
DRAPE COLUMN DVNC XI (DISPOSABLE) ×1 IMPLANT
DRAPE DA VINCI XI ARM (DISPOSABLE) ×4
DRAPE DA VINCI XI COLUMN (DISPOSABLE) ×1
DRAPE SHEET LG 3/4 BI-LAMINATE (DRAPES) ×2 IMPLANT
DRAPE SURG IRRIG POUCH 19X23 (DRAPES) ×2 IMPLANT
ELECT REM PT RETURN 15FT ADLT (MISCELLANEOUS) ×2 IMPLANT
GAUZE 4X4 16PLY RFD (DISPOSABLE) IMPLANT
GLOVE BIO SURGEON STRL SZ 6 (GLOVE) ×8 IMPLANT
GLOVE BIO SURGEON STRL SZ 6.5 (GLOVE) IMPLANT
GOWN STRL REUS W/ TWL LRG LVL3 (GOWN DISPOSABLE) ×4 IMPLANT
GOWN STRL REUS W/TWL LRG LVL3 (GOWN DISPOSABLE) ×4
HEMOSTAT SURGICEL 4X8 (HEMOSTASIS) ×1 IMPLANT
HOLDER FOLEY CATH W/STRAP (MISCELLANEOUS) ×2 IMPLANT
IRRIG SUCT STRYKERFLOW 2 WTIP (MISCELLANEOUS) ×2
IRRIGATION SUCT STRKRFLW 2 WTP (MISCELLANEOUS) ×1 IMPLANT
KIT PROCEDURE DA VINCI SI (MISCELLANEOUS)
KIT PROCEDURE DVNC SI (MISCELLANEOUS) IMPLANT
KIT TURNOVER KIT A (KITS) IMPLANT
MANIPULATOR UTERINE 4.5 ZUMI (MISCELLANEOUS) ×2 IMPLANT
NDL SPNL 18GX3.5 QUINCKE PK (NEEDLE) IMPLANT
NEEDLE HYPO 22GX1.5 SAFETY (NEEDLE) IMPLANT
NEEDLE SPNL 18GX3.5 QUINCKE PK (NEEDLE) IMPLANT
OBTURATOR OPTICAL STANDARD 8MM (TROCAR) ×1
OBTURATOR OPTICAL STND 8 DVNC (TROCAR) ×1
OBTURATOR OPTICALSTD 8 DVNC (TROCAR) ×1 IMPLANT
PACK ROBOT GYN CUSTOM WL (TRAY / TRAY PROCEDURE) ×2 IMPLANT
PAD POSITIONING PINK XL (MISCELLANEOUS) ×2 IMPLANT
PORT ACCESS TROCAR AIRSEAL 12 (TROCAR) ×1 IMPLANT
PORT ACCESS TROCAR AIRSEAL 5M (TROCAR) ×1
POUCH SPECIMEN RETRIEVAL 10MM (ENDOMECHANICALS) ×1 IMPLANT
SEAL CANN UNIV 5-8 DVNC XI (MISCELLANEOUS) ×3 IMPLANT
SEAL XI 5MM-8MM UNIVERSAL (MISCELLANEOUS) ×3
SET TRI-LUMEN FLTR TB AIRSEAL (TUBING) ×2 IMPLANT
SURGIFLO W/THROMBIN 8M KIT (HEMOSTASIS) ×2 IMPLANT
SUT VIC AB 0 CT1 27 (SUTURE)
SUT VIC AB 0 CT1 27XBRD ANTBC (SUTURE) IMPLANT
SUT VIC AB 3-0 SH 27 (SUTURE) ×3
SUT VIC AB 3-0 SH 27XBRD (SUTURE) IMPLANT
SUT VIC AB 4-0 PS2 18 (SUTURE) ×4 IMPLANT
SYR 10ML LL (SYRINGE) IMPLANT
TOWEL OR NON WOVEN STRL DISP B (DISPOSABLE) ×2 IMPLANT
TRAP SPECIMEN MUCOUS 40CC (MISCELLANEOUS) IMPLANT
TRAY FOLEY MTR SLVR 16FR STAT (SET/KITS/TRAYS/PACK) ×2 IMPLANT
UNDERPAD 30X30 (UNDERPADS AND DIAPERS) ×2 IMPLANT
WATER STERILE IRR 1000ML POUR (IV SOLUTION) ×2 IMPLANT

## 2018-12-05 NOTE — Anesthesia Postprocedure Evaluation (Signed)
Anesthesia Post Note  Patient: Natalie Price  Procedure(s) Performed: XI ROBOTIC ASSISTED LAPAROSCOPIC BILATERAL OVARIAN CYSTECTOMY, LYSIS OF ADHESIONS (Bilateral )     Patient location during evaluation: PACU Anesthesia Type: General Level of consciousness: awake and alert Pain management: pain level controlled Vital Signs Assessment: post-procedure vital signs reviewed and stable Respiratory status: spontaneous breathing, nonlabored ventilation and respiratory function stable Cardiovascular status: blood pressure returned to baseline and stable Postop Assessment: no apparent nausea or vomiting Anesthetic complications: no    Last Vitals:  Vitals:   12/05/18 1645 12/05/18 1700  BP: 127/86 (!) 127/95  Pulse: 84 78  Resp: 14 18  Temp:  36.6 C  SpO2: 97% 95%    Last Pain:  Vitals:   12/05/18 1645  TempSrc:   PainSc: [place]

## 2018-12-05 NOTE — Discharge Instructions (Signed)
Return to work: 4 weeks (2 weeks with physical restrictions).  Activity: 1. Be up and out of the bed during the day.  Take a nap if needed.  You may walk up steps but be careful and use the hand rail.  Stair climbing will tire you more than you think, you may need to stop part way and rest.   2. No lifting or straining for 4 weeks.  3. No driving for 1 weeks.  Do Not drive if you are taking narcotic pain medicine.  4. Shower daily.  Use soap and water on your incision and pat dry; don't rub.   5. No sexual activity and nothing in the vagina for 8 weeks.  Medications:  - Take ibuprofen and tylenol first line for pain control. Take these regularly (every 6 hours) to decrease the build up of pain.  - If necessary, for severe pain not relieved by ibuprofen, contact Dr Serita Grit office and you will be prescribed percocet.  - While taking percocet you should take sennakot every night to reduce the likelihood of constipation. If this causes diarrhea, stop its use.  Diet: 1. Low sodium Heart Healthy Diet is recommended.  2. It is safe to use a laxative if you have difficulty moving your bowels.   Wound Care: 1. Keep clean and dry.  Shower daily.  Reasons to call the Doctor:   Fever - Oral temperature greater than 100.4 degrees Fahrenheit  Foul-smelling vaginal discharge  Difficulty urinating  Nausea and vomiting  Increased pain at the site of the incision that is unrelieved with pain medicine.  Difficulty breathing with or without chest pain  New calf pain especially if only on one side  Sudden, continuing increased vaginal bleeding with or without clots.   Follow-up: 1. See Everitt Amber in 4 weeks.  Contacts: For questions or concerns you should contact:  Dr. Everitt Amber at [phone] After hours and on week-ends call [phone] and ask to speak to the physician on call for Gynecologic Oncology    Ovarian Cystectomy, Care After This sheet gives you information about  how to care for yourself after your procedure. Your health care provider may also give you more specific instructions. If you have problems or questions, contact your health care provider. What can I expect after the procedure? After the procedure, it is common to have:  Pain in your abdomen, especially at the incision areas. You will be given pain medicines to control the pain.  Tiredness. This is a normal part of the recovery process. Your energy level will return to normal over the next several weeks.  Problems passing stool (constipation). Follow these instructions at home: Medicines  Take over-the-counter and prescription medicines only as told by your health care provider.  If you were prescribed an antibiotic medicine, use it as told by your health care provider. Do not stop using the antibiotic even if you start to feel better.  Do not take aspirin because it can cause bleeding.  Do not drink alcohol while taking prescription pain medicine.  Do not drive or use heavy machinery while taking prescription pain medicine. Incision care   Follow instructions from your health care provider about how to take care of your incisions. Make sure you: ? Wash your hands with soap and water before you change your bandage (dressing). If soap and water are not available, use hand sanitizer. ? Change your dressing as told by your health care provider. ? Leave stitches (sutures), skin glue,  or adhesive strips in place. These skin closures may need to stay in place for 2 weeks or longer. If adhesive strip edges start to loosen and curl up, you may trim the loose edges. Do not remove adhesive strips completely unless your health care provider tells you to do that.  Check your incision areas every day for signs of infection. Check for: ? Redness, swelling, or pain. ? Fluid or blood. ? Warmth. ? Pus or a bad smell.  Do not take baths, swim, or use a hot tub until your health care provider  approves. Take showers instead of baths. Activity  Return to your normal activities and diet as told by your health care provider. Ask your health care provider what activities are safe for you.  Take rest breaks during the day as needed.  Do not drive until your health care provider approves. General instructions  Do not douche, use tampons, or have sexual intercourse until your health care provider says it is okay to do so.  To prevent or treat constipation while you are taking prescription pain medicine, your health care provider may recommend that you: ? Take over-the-counter or prescription medicines. ? Eat foods that are high in fiber, such as fresh fruits and vegetables, whole grains, and beans. ? Drink enough fluid to keep your urine clear or pale yellow. ? Limit foods that are high in fat and processed sugars, such as fried and sweet foods.  Keep all follow-up visits as told by your health care provider. This is important. Contact a health care provider if:  You have a fever.  You feel nauseous or you vomit.  You have pain when you urinate or have blood in your urine.  You have a rash on your body.  You have pain or redness where the IV was inserted.  You have pain that is not relieved with medicine.  You have signs of infection, such as: ? Redness, swelling, or pain around your incisions. ? Fluid or blood coming from your incisions. ? An incision that feels warm to the touch. ? Pus or a bad smell coming from your incisions. Get help right away if:  You have chest pain or shortness of breath.  You feel dizzy or light-headed.  You have increasing abdominal pain that is not relieved with medicines.  You have pain, swelling, or redness in your leg.  Your incision is opening (the edges are not staying together). Summary  After the procedure, it is common to have some pain in your abdomen. You will be given pain medicines to control the pain.  Follow  instructions from your health care provider about how to take care of your incisions.  Do not douche, use tampons, or have sexual intercourse until your health care provider says it is okay to do so.  Keep all follow-up visits as told by your health care provider. This is important. This information is not intended to replace advice given to you by your health care provider. Make sure you discuss any questions you have with your health care provider. Document Released: 02/28/2013 Document Revised: 04/22/2017 Document Reviewed: 06/29/2016 Elsevier Patient Education  2020 [place] Anesthesia, Adult, Care After This sheet gives you information about how to care for yourself after your procedure. Your health care provider may also give you more specific instructions. If you have problems or questions, contact your health care provider. What can I expect after the procedure? After the procedure, the following side effects  are common:  Pain or discomfort at the IV site.  Nausea.  Vomiting.  Sore throat.  Trouble concentrating.  Feeling cold or chills.  Weak or tired.  Sleepiness and fatigue.  Soreness and body aches. These side effects can affect parts of the body that were not involved in surgery. Follow these instructions at home:  For at least 24 hours after the procedure:  Have a responsible adult stay with you. It is important to have someone help care for you until you are awake and alert.  Rest as needed.  Do not: ? Participate in activities in which you could fall or become injured. ? Drive. ? Use heavy machinery. ? Drink alcohol. ? Take sleeping pills or medicines that cause drowsiness. ? Make important decisions or sign legal documents. ? Take care of children on your own. Eating and drinking  Follow any instructions from your health care provider about eating or drinking restrictions.  When you feel hungry, start by eating small amounts of  foods that are soft and easy to digest (bland), such as toast. Gradually return to your regular diet.  Drink enough fluid to keep your urine pale yellow.  If you vomit, rehydrate by drinking water, juice, or clear broth. General instructions  If you have sleep apnea, surgery and certain medicines can increase your risk for breathing problems. Follow instructions from your health care provider about wearing your sleep device: ? Anytime you are sleeping, including during daytime naps. ? While taking prescription pain medicines, sleeping medicines, or medicines that make you drowsy.  Return to your normal activities as told by your health care provider. Ask your health care provider what activities are safe for you.  Take over-the-counter and prescription medicines only as told by your health care provider.  If you smoke, do not smoke without supervision.  Keep all follow-up visits as told by your health care provider. This is important. Contact a health care provider if:  You have nausea or vomiting that does not get better with medicine.  You cannot eat or drink without vomiting.  You have pain that does not get better with medicine.  You are unable to pass urine.  You develop a skin rash.  You have a fever.  You have redness around your IV site that gets worse. Get help right away if:  You have difficulty breathing.  You have chest pain.  You have blood in your urine or stool, or you vomit blood. Summary  After the procedure, it is common to have a sore throat or nausea. It is also common to feel tired.  Have a responsible adult stay with you for the first 24 hours after general anesthesia. It is important to have someone help care for you until you are awake and alert.  When you feel hungry, start by eating small amounts of foods that are soft and easy to digest (bland), such as toast. Gradually return to your regular diet.  Drink enough fluid to keep your urine pale  yellow.  Return to your normal activities as told by your health care provider. Ask your health care provider what activities are safe for you. This information is not intended to replace advice given to you by your health care provider. Make sure you discuss any questions you have with your health care provider. Document Released: 08/16/2000 Document Revised: 05/13/2017 Document Reviewed: 12/24/2016 Elsevier Patient Education  2020 [employer].

## 2018-12-05 NOTE — Interval H&P Note (Signed)
History and Physical Interval Note:  12/05/2018 12:52 PM  Natalie Price  has presented today for surgery, with the diagnosis of BILATERAL OVARIAN CYST, ELEVATED CA125.  The various methods of treatment have been discussed with the patient and family. After consideration of risks, benefits and other options for treatment, the patient has consented to  Procedure(s): XI ROBOTIC ASSISTED LAPAROSCOPIC BILATERAL OVARIAN CYSTECTOMY, POSSIBLE BILATERAL SALPINECTOMY, POSSIBLE OOPHORECTOMY, POSSIBLE STAGING (Bilateral) as a surgical intervention.  The patient's history has been reviewed, patient examined, no change in status, stable for surgery.  I have reviewed the patient's chart and labs.  Questions were answered to the patient's satisfaction.     Thereasa Solo

## 2018-12-05 NOTE — Anesthesia Preprocedure Evaluation (Addendum)
Anesthesia Evaluation  Patient identified by MRN, date of birth, ID band Patient awake    Reviewed: Allergy & Precautions, NPO status , Patient's Chart, lab work & pertinent test results  History of Anesthesia Complications Negative for: history of anesthetic complications  Airway Mallampati: II  TM Distance: >3 FB Neck ROM: Full    Dental no notable dental hx.    Pulmonary neg pulmonary ROS,    Pulmonary exam normal        Cardiovascular negative cardio ROS Normal cardiovascular exam     Neuro/Psych negative neurological ROS     GI/Hepatic negative GI ROS, Neg liver ROS,   Endo/Other  negative endocrine ROS  Renal/GU negative Renal ROS     Musculoskeletal negative musculoskeletal ROS (+)   Abdominal   Peds  Hematology negative hematology ROS (+)   Anesthesia Other Findings Day of surgery medications reviewed with the patient.  Reproductive/Obstetrics Ovarian cysts                            Anesthesia Physical Anesthesia Plan  ASA: I  Anesthesia Plan: General   Post-op Pain Management:    Induction: Intravenous  PONV Risk Score and Plan: 4 or greater and Treatment may vary due to age or medical condition, Ondansetron, Dexamethasone, Midazolam and Scopolamine patch - Pre-op  Airway Management Planned: Oral ETT  Additional Equipment:   Intra-op Plan:   Post-operative Plan: Extubation in OR  Informed Consent: I have reviewed the patients History and Physical, chart, labs and discussed the procedure including the risks, benefits and alternatives for the proposed anesthesia with the patient or authorized representative who has indicated his/her understanding and acceptance.     Dental advisory given  Plan Discussed with: CRNA  Anesthesia Plan Comments:        Anesthesia Quick Evaluation

## 2018-12-05 NOTE — Anesthesia Procedure Notes (Signed)
Procedure Name: Intubation Date/Time: 12/05/2018 1:33 PM Performed by: Myna Bright, CRNA Pre-anesthesia Checklist: Patient identified, Emergency Drugs available, Suction available and Patient being monitored Patient Re-evaluated:Patient Re-evaluated prior to induction Oxygen Delivery Method: Circle system utilized Preoxygenation: Pre-oxygenation with 100% oxygen Induction Type: IV induction Ventilation: Mask ventilation without difficulty Laryngoscope Size: Mac and 3 Grade View: Grade I Tube type: Oral Tube size: 7.0 mm Number of attempts: 1 Airway Equipment and Method: Stylet Placement Confirmation: ETT inserted through vocal cords under direct vision,  positive ETCO2 and breath sounds checked- equal and bilateral Secured at: 21 cm Tube secured with: Tape Dental Injury: Teeth and Oropharynx as per pre-operative assessment

## 2018-12-05 NOTE — Op Note (Addendum)
OPERATIVE NOTE  Date: 12/05/18  Preoperative Diagnosis: bilateral ovarian cysts, elevated CA 125   Postoperative Diagnosis:  Stage 4 endometriosis, bilateral endometriomas  Procedure(s) Performed: Robotic-assisted laparoscopic bilateral ovarian cystectomy, lysis of adhesions  Surgeon: Everitt Amber, M.D.  Assistant Surgeon: Lahoma Crocker M.D. (an MD assistant was necessary for tissue manipulation, management of robotic instrumentation, retraction and positioning due to the complexity of the case and hospital policies).   Anesthesia: Gen. endotracheal.  Specimens: Bilateral ovarian cysts, pelvic washings  Estimated Blood Loss: 100 mL. Blood Replacement: None  Complications: none  Indication for Procedure:  Patient had abdominal distension, elevated CA 125, small volume ascites and bilateral large ovarian cysts  Operative Findings: left ovary distended with 17cm cystic mass containing old blood adherent to sigmoid colon and posterior uterus and left ovarian fossa. RIght ovary enlarged with 10cm cystic mass containing old blood. Trace ascites. Ovaries kiss in midline posterior and obliterated cul de sac with rectum densely adherent to ovaries bilaterally and posterior uterus and cervix.  Stage 4 endometriosis. Hemosiderin deposits throughout omentum and peritoneum and diaphragm.  Frozen pathology was consistent with benign hemorrhagic cysts  Procedure: The patient's taken to the operating room and placed under general endotracheal anesthesia testing difficulty. She is placed in a dorsolithotomy position and cervical acromial pad was placed. The arms were tucked with care taken to pad the olecranon process. And prepped and draped in usual sterile fashion. A uterine manipulator (zumi) was placed vaginally. A 78mm incision was made in the left upper quadrant palmer's point and a 5 mm Optiview trocar used to enter the abdomen under direct visualization. With entry into the abdomen and then  maintenance of 15 mm of mercury the patient was placed in Trendelenburg position. An incision was made 5cm above the umbilicus and a 3mm trochar was placed through this site. Two incisions were made lateral to the umbilical incision in the left and right abdomen measuring 68mm. These incisions were made approximately 10 cm lateral to the umbilical incision. 8 mm robotic trochars were inserted. A similar incision was made in the left lateral abdomen. The robot was docked.  The abdomen was inspected as was the pelvis. Pelvic washings were obtained.   For 30 minutes sharp and electrosurgical dissection was performed to separate the attachments between the ovaries and eachother and the rectum and posterior uterus.  In the process of doing this there was unavoidable rupture of the cysts as they were densely adherent with the retroperitoneum and uterus.   The right ovarian cyst was opened and the cyst wall was carefully grasped and peeled from its attachments to the ovarian capsule. After it was freed entirely from its attachments it was placed in an endocatch bag. There was substantial oozing from the capsule bed. This was made hemostatic with floseal, 3-0 vicryl sutures and surgicel.  A similar process took place for hte larger cyst on the left which was dissected free from its attachments to the ovarian capsule and made hemostatic with sutures and floseal.  Surgicel was placed across the posterior cul de sac and bilateral ovaries.   When adequate hemostasis was obtained the robot was undocked.   The contents of the Endo Catch bags were first aspirated and then morcellated to facilitate removal from the abdominal cavity through the left upper quadrant incision. In a similar fashion the contents of the right Endo Catch bag or morcellated to facilitate removal from the abdominal cavity. Frozen section confirmed benign cysts.  The ports were all remove.  The fascial closure at the umbilical incision and left  upper quadrant port was made with 0 Vicryl.  All incisions were closed with a running subcuticular Monocryl suture. Dermabond was applied. Sponge, lap and needle counts were correct x 3.    The patient had sequential compression devices for VTE prophylaxis.         Disposition: PACU          Condition: stable  Donaciano Eva, MD

## 2018-12-05 NOTE — Transfer of Care (Signed)
Immediate Anesthesia Transfer of Care Note  Patient: Natalie Price  Procedure(s) Performed: XI ROBOTIC ASSISTED LAPAROSCOPIC BILATERAL OVARIAN CYSTECTOMY, LYSIS OF ADHESIONS (Bilateral )  Patient Location: PACU  Anesthesia Type:General  Level of Consciousness: awake, alert , oriented and patient cooperative  Airway & Oxygen Therapy: Patient Spontanous Breathing and Patient connected to nasal cannula oxygen  Post-op Assessment: Report given to RN, Post -op Vital signs reviewed and stable and Patient moving all extremities  Post vital signs: Reviewed and stable  Last Vitals:  Vitals Value Taken Time  BP 149/133 12/05/18 1615  Temp    Pulse 81 12/05/18 1617  Resp 22 12/05/18 1617  SpO2 100 % 12/05/18 1617  Vitals shown include unvalidated device data.  Last Pain:  Vitals:   12/05/18 1117  TempSrc: Oral         Complications: No apparent anesthesia complications

## 2018-12-06 ENCOUNTER — Telehealth: Payer: Self-pay

## 2018-12-06 ENCOUNTER — Encounter (HOSPITAL_COMMUNITY): Payer: Self-pay | Admitting: Gynecologic Oncology

## 2018-12-06 NOTE — Telephone Encounter (Signed)
Ms Natalie Price is doing well postoperatively. Afebrile. Incisions are dry and intact. Abdomen sore. Pt using Oxycodone 5 mg and Ibuprofen 600 mg with good effect.  Suggested splinting her abdomen with a small pillow when she get up from bed/chair or using the restroom to help decrease pain. Pt has some burning with urination.  Told her to increase water intake to 64 oz daily.  Told her to call Friday 12-08-18 if the burning with urination remains as she had a foley catheter in for surgery. She may need to come in for a urinalysis. She is eating small amounts. She is a grazer. She has been nauseous at times. No vomiting. She is burping.  No BM or passing gas.  Told her to begin the senokot-S now and can take dose this evening as well. If no BM by Friday she could ad a cupful of Miralax bid. Will refill phenergan as she has 1 tab left per Joylene John, NP. Ms Natalie Price knows to call the office at [phone] she if has any questions or concerns.  Told her that the surgical  pathology from the surgery shows no cancer per Joylene John, NP.

## 2018-12-29 ENCOUNTER — Encounter: Payer: Self-pay | Admitting: Gynecologic Oncology

## 2018-12-29 ENCOUNTER — Other Ambulatory Visit: Payer: Self-pay

## 2018-12-29 ENCOUNTER — Inpatient Hospital Stay: Payer: BC Managed Care – PPO | Attending: Gynecologic Oncology | Admitting: Gynecologic Oncology

## 2018-12-29 VITALS — BP 120/69 | HR 64 | Temp 99.1°F | Resp 18 | Ht 64.0 in | Wt 152.3 lb

## 2018-12-29 DIAGNOSIS — Z7189 Other specified counseling: Secondary | ICD-10-CM | POA: Diagnosis not present

## 2018-12-29 DIAGNOSIS — N801 Endometriosis of ovary: Secondary | ICD-10-CM

## 2018-12-29 DIAGNOSIS — N80109 Endometriosis of ovary, unspecified side, unspecified depth: Secondary | ICD-10-CM

## 2018-12-29 DIAGNOSIS — Z9889 Other specified postprocedural states: Secondary | ICD-10-CM

## 2018-12-29 NOTE — Patient Instructions (Signed)
Dr Denman George has cleared you for swimming and bathing and intercourse.  Dr Denman George recommends that you try for a family (try to conceive) for 3 months. However, if natural conception is not occurring at that time, you should discuss with Dr Glo Herring options for fertility including referral to a specialist reproductive endocrinologist.  If you decide that you would not like to have a family right now, you should consider treatment with medical therapies (such as oral contraceptive pills) to suppress redevelopment of endometriosis.   Dr Denman George can be reached at [phone].

## 2018-12-29 NOTE — Progress Notes (Signed)
Follow-up Note: Gyn-Onc  Consult was initially requested by Dr. Glo Herring for the evaluation of Natalie Price 28 y.o. female  CC:  Chief Complaint  Patient presents with  . Endometriosis    s/p surgery    Assessment/Plan:  Natalie Price  is a 28 y.o.  year old with bilateral endometriomas.   I discussed the findings at surgery and pathology findings of bilateral endometriomas.  The patient is already experiencing improvement in menstrual symptoms, and dyspareunia.  I discussed that endometriosis can have negative impact on fertility particularly endometriomas.  Natalie Price had significant pelvic adhesive disease though her tubes appeared grossly normal.  However ovaries were grossly distended and distorted with these ovarian cysts, and kissing posteriorly in the midline adherent to the rectum.  We discussed that a good option for her would be to attempt conception over the next 3 months.  If she does not spontaneously conceive over that time.  I would recommend she discuss options with Dr. Glo Herring including potential referral to reproductive endocrinology, given the extent of her endometriosis.  I explained that sometimes pregnancy can have a positive impact on endometriosis including some resolution of disease.  Alternatively if she does not conceive or does not desire conception, I would recommend medical therapy of her endometriosis with attic continuous birth control pills or potentially a progestin releasing IUD.  She will follow-up with Dr. Glo Herring to discuss these options further.  HPI: Natalie Price is a very pleasant 28 year old P0 who is seen in consultation at the request of Dr Glo Herring for bilateral ovarian cysts, ascites and elevated CA 125.  The patient reports a gradual increase in abdominal girth through the springtime of 2020 (after removal of her IUD and attempted pregnancy).  She felt this was just attributed to weight gain.  She been trying to conceive but have been  unsuccessful.  In the first week of June 2020 she developed some back pain that was vague.  It became progressively worse until eventually on November 06, 2018 she developed severe abdominal pains and abdominal distention which necessitated her to visit the emergency department at [facility].  At that time a CT scan of the abdomen and pelvis was performed (on November 01, 2018).  This revealed multiple large bilateral cystic lesions of the ovaries and adnexa measuring up to 9.1 cm on the left and 8.2 cm on the right.  There was no lymphadenopathy, peritoneal carcinomatosis, but there was a small volume ascites.  The radiologist felt these may be unusually large functional cysts or follicles, and could also represent hydrosalpinx or tubo-ovarian abscesses.  There was submucosal enhancement in the sigmoid colon which was suggestive for colitis.  A transvaginal ultrasound scan was performed in the ED on that same day.  It revealed a uterus measuring 8 x 4.7 x 4.7 cm with no fibroids or other masses.  The endometrium was 4 mm.  There was bilateral adnexal masses.  A mass was seen in the right adnexa measuring 10 x 6.5 x 7.6 cm which could represent a paraovarian mass or large cyst displacing the right ovary.  There was blood flow within the periphery of this largely cystic mass.  In the left adnexa was a 9.9 x 8.8 x 10.1 cm largely cystic mass and a smaller adjacent complex cystic mass which could represent a solid nodule.  There was no blood flow within the nodular component.  There was however blood flow within the periphery of this largely cystic mass.  A  small amount of free fluid was seen in the pelvis.  A Ca1 25 was drawn on November 07, 2018 and this was elevated at 581.  Following her emergency room visit her abdominal discomfort subsided significantly.  She still has some mild lower abdominal distention but otherwise no significant pain like the one that had sent her to the emergency department.   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  She is otherwise a very healthy young woman.  She is never had abdominal surgery.  The patient has no history of significantly abnormal Paps (nothing that follow-up surveillance is required).  She has no history of pelvic inflammatory disease, gonorrhea, or chlamydia.  She denies fevers or chills.  She denies abnormal vaginal discharge.  While she was in the emergency department CBC was drawn which revealed a normal white blood cell count of 10,000.  Platelet count was mildly elevated.  Interval Hx:  On December 05, 2018 she underwent a robotic assisted laparoscopic bilateral ovarian cystectomy with lysis of adhesions.  Intraoperative findings were significant for the left ovary distended with a 17 cm cystic mass containing old blood adherent to the sigmoid colon and posterior uterus and left ovarian fossa.  The right ovary was enlarged with 10 cm cystic mass containing old blood.  There was trace trace ascites.  The ovaries were kissing in the midline posteriorly with an obliterated cul-de-sac with the rectum densely adherent to the ovaries bilaterally and posterior uterus and cervix.  There was stage IV endometriosis with hemosiderin deposits throughout the omentum and peritoneum and diaphragm.  Frozen section was consistent with a benign hemorrhagic mass.  Final pathology of both ovarian cysts revealed benign endometriosis.  Washings were benign.  Since surgery Natalie Price has felt better with less pain with intercourse and less abdominal distention and her most recent period was significantly less painful.  Current Meds:  Outpatient Encounter Medications as of 12/29/2018  Medication Sig  . acetaminophen (TYLENOL) 500 MG tablet Take 1,000 mg by mouth every 6 (six) hours as needed for mild pain or moderate pain.  Marland Kitchen ibuprofen (ADVIL) 600 MG tablet Take 1 tablet (600 mg total) by mouth every 6 (six) hours  as needed. For AFTER surgery only  . oxyCODONE (OXY IR/ROXICODONE) 5 MG immediate release tablet Take 1 tablet (5 mg total) by mouth every 4 (four) hours as needed for severe pain. For AFTER surgery only, do not take and drive  . promethazine (PHENERGAN) 25 MG tablet Take 0.5-1 tablets (12.5-25 mg total) by mouth every 6 (six) hours as needed for nausea or vomiting.  . senna-docusate (SENOKOT-S) 8.6-50 MG tablet Take 2 tablets by mouth at bedtime. For AFTER surgery, do not take if having diarrhea   No facility-administered encounter medications on file as of 12/29/2018.     Allergy: No Known Allergies  Social Hx:   Social History   Socioeconomic History  . Marital status: Married    Spouse name: Not on file  . Number of children: Not on file  . Years of education: Not on file  . Highest education level: Not on file  Occupational History  . Not on file  Social Needs  . Financial resource strain: Not on file  . Food insecurity    Worry: Not on file    Inability: Not on file  . Transportation needs    Medical: Not on file    Non-medical: Not on file  Tobacco  Use  . Smoking status: Never Smoker  . Smokeless tobacco: Never Used  Substance and Sexual Activity  . Alcohol use: No  . Drug use: No  . Sexual activity: Yes  Lifestyle  . Physical activity    Days per week: Not on file    Minutes per session: Not on file  . Stress: Not on file  Relationships  . Social Herbalist on phone: Not on file    Gets together: Not on file    Attends religious service: Not on file    Active member of club or organization: Not on file    Attends meetings of clubs or organizations: Not on file    Relationship status: Not on file  . Intimate partner violence    Fear of current or ex partner: Not on file    Emotionally abused: Not on file    Physically abused: Not on file    Forced sexual activity: Not on file  Other Topics Concern  . Not on file  Social History Narrative    Starting Surg Tech program at [employer]    Past Surgical Hx:  Past Surgical History:  Procedure Laterality Date  . FRACTURE SURGERY     LEFT ARM  . ROBOTIC ASSISTED LAPAROSCOPIC OVARIAN CYSTECTOMY Bilateral 12/05/2018   Procedure: XI ROBOTIC ASSISTED LAPAROSCOPIC BILATERAL OVARIAN CYSTECTOMY, LYSIS OF ADHESIONS;  Surgeon: Everitt Amber, MD;  Location: [facility];  Service: Gynecology;  Laterality: Bilateral;  . WISDOM TOOTH EXTRACTION      Past Medical Hx: History reviewed. No pertinent past medical history.  Past Gynecological History:  See HPI No LMP recorded.  Family Hx:  Family History  Problem Relation Age of Onset  . Breast cancer Paternal Aunt     Review of Systems:  Constitutional  Feels well,  But with some mild lower pelvic discomfort  ENT Normal appearing ears and nares bilaterally Skin/Breast  No rash, sores, jaundice, itching, dryness Cardiovascular  No chest pain, shortness of breath, or edema  Pulmonary  No cough or wheeze.  Gastro Intestinal  No nausea, vomitting, or diarrhoea. No bright red blood per rectum, no abdominal pain, change in bowel movement, or constipation.  Genito Urinary  No frequency, urgency, dysuria, + pelvic pain Musculo Skeletal  No myalgia, arthralgia, joint swelling or pain  Neurologic  No weakness, numbness, change in gait,  Psychology  No depression, anxiety, insomnia.   Vitals:  Blood pressure 120/69, pulse 64, temperature 99.1 F (37.3 C), temperature source Oral, resp. rate 18, height 5\' 4"  (1.626 m), weight 152 lb 4.8 oz (69.1 kg), SpO2 100 %.  Physical Exam: WD in NAD Neck  Supple NROM, without any enlargements.  Lymph Node Survey No cervical supraclavicular or inguinal adenopathy Cardiovascular  Pulse normal rate, regularity and rhythm. S1 and S2 normal.  Lungs  Clear to auscultation bilateraly, without wheezes/crackles/rhonchi. Good air movement.  Skin  No rash/lesions/breakdown  Psychiatry  Alert and oriented to person,  place, and time  Abdomen  Normoactive bowel sounds, abdomen soft, non-tender and nonobese without evidence of hernia. Well healed incisions.  Back No CVA tenderness Genito Urinary  deferred Rectal  deferred Extremities  No bilateral cyanosis, clubbing or edema.   20 minutes of direct face to face counseling time was spent with the patient. This included discussion about prognosis, therapy recommendations and postoperative side effects and are beyond the scope of routine postoperative care.  Thereasa Solo, MD  12/29/2018, 5:06 PM

## 2019-01-09 ENCOUNTER — Encounter: Payer: Self-pay | Admitting: Family

## 2019-01-09 ENCOUNTER — Ambulatory Visit (INDEPENDENT_AMBULATORY_CARE_PROVIDER_SITE_OTHER): Payer: BC Managed Care – PPO | Admitting: Family

## 2019-01-09 DIAGNOSIS — L209 Atopic dermatitis, unspecified: Secondary | ICD-10-CM

## 2019-01-09 MED ORDER — TRIAMCINOLONE ACETONIDE 0.1 % EX CREA: 1.0000 "application " | TOPICAL_CREAM | Freq: Two times a day (BID) | CUTANEOUS | 1 refills | Status: DC

## 2019-01-09 NOTE — Progress Notes (Signed)
   Virtual Visit via telephone Note Due to COVID-19 pandemic this visit was conducted virtually. This visit type was conducted due to national recommendations for restrictions regarding the COVID-19 Pandemic (e.g. social distancing, sheltering in place) in an effort to limit this patient's exposure and mitigate transmission in our community. All issues noted in this document were discussed and addressed.  A physical exam was not performed with this format.  I connected with Natalie Price on 01/09/19 at 3:08 pm by telephone and verified that I am speaking with the correct person using two identifiers. Natalie Price is currently located at work and no one is currently with her during visit. The provider, Evelina Dun, FNP is located in their office at time of visit.  I discussed the limitations, risks, security and privacy concerns of performing an evaluation and management service by telephone and the availability of in person appointments. I also discussed with the patient that there may be a patient responsible charge related to this service. The patient expressed understanding and agreed to proceed.   History and Present Illness:  Rash This is a new problem. The current episode started 1 to 4 weeks ago. The problem has been waxing and waning since onset. The affected locations include the face, left lower leg, right lower leg and abdomen. The rash is characterized by itchiness and redness. Past treatments include anti-itch cream. The treatment provided mild relief. Her past medical history is significant for eczema.      Review of Systems  Skin: Positive for rash.  All other systems reviewed and are negative.    Observations/Objective: No SOB or distress noted   Assessment and Plan: 1. Atopic dermatitis, unspecified type Do not scratch Avoid hot showers Avoid strong scented lotions and perfumes RTO if symptoms worsen or do not improve  - triamcinolone cream (KENALOG) 0.1 %;  Apply 1 application topically 2 (two) times daily.  Dispense: 60 g; Refill: 1     I discussed the assessment and treatment plan with the patient. The patient was provided an opportunity to ask questions and all were answered. The patient agreed with the plan and demonstrated an understanding of the instructions.   The patient was advised to call back or seek an in-person evaluation if the symptoms worsen or if the condition fails to improve as anticipated.  The above assessment and management plan was discussed with the patient. The patient verbalized understanding of and has agreed to the management plan. Patient is aware to call the clinic if symptoms persist or worsen. Patient is aware when to return to the clinic for a follow-up visit. Patient educated on when it is appropriate to go to the emergency department.   Time call ended:  3:23 pm  I provided 15 minutes of non-face-to-face time during this encounter.    Evelina Dun, FNP

## 2019-01-31 ENCOUNTER — Other Ambulatory Visit: Payer: Self-pay

## 2019-02-01 ENCOUNTER — Ambulatory Visit (INDEPENDENT_AMBULATORY_CARE_PROVIDER_SITE_OTHER): Payer: BC Managed Care – PPO | Admitting: Family

## 2019-02-01 ENCOUNTER — Encounter: Payer: Self-pay | Admitting: Family

## 2019-02-01 VITALS — BP 127/81 | HR 80 | Temp 98.9°F | Ht 64.0 in | Wt 153.0 lb

## 2019-02-01 DIAGNOSIS — L503 Dermatographic urticaria: Secondary | ICD-10-CM

## 2019-02-01 NOTE — Progress Notes (Signed)
   Subjective:    Patient ID: Natalie Price, female    DOB: 12-Sep-1990, 28 y.o.   MRN: [id number]  Chief Complaint  Patient presents with  . Pruritis    Rash This is a new problem. The current episode started 1 to 4 weeks ago. The problem has been waxing and waning since onset. The affected locations include the back and chest. She was exposed to nothing. Pertinent negatives include no congestion, diarrhea, fatigue, fever, shortness of breath or sore throat. Past treatments include topical steroids and anti-itch cream. The treatment provided no relief.      Review of Systems  Constitutional: Negative for fatigue and fever.  HENT: Negative for congestion and sore throat.   Respiratory: Negative for shortness of breath.   Gastrointestinal: Negative for diarrhea.  Skin: Positive for rash.  All other systems reviewed and are negative.      Objective:   Physical Exam Vitals signs reviewed.  Constitutional:      General: She is not in acute distress.    Appearance: She is well-developed.  HENT:     Head: Normocephalic and atraumatic.     Right Ear: External ear normal.  Eyes:     Pupils: Pupils are equal, round, and reactive to light.  Neck:     Musculoskeletal: Normal range of motion and neck supple.     Thyroid: No thyromegaly.  Cardiovascular:     Rate and Rhythm: Normal rate and regular rhythm.     Heart sounds: Normal heart sounds. No murmur.  Pulmonary:     Effort: Pulmonary effort is normal. No respiratory distress.     Breath sounds: Normal breath sounds. No wheezing.  Abdominal:     General: Bowel sounds are normal. There is no distension.     Palpations: Abdomen is soft.     Tenderness: There is no abdominal tenderness.  Musculoskeletal: Normal range of motion.        General: No tenderness.  Skin:    General: Skin is warm and dry.     Findings: Rash present.     Comments: Erythemas, raised rash appeared after writing on her back and left forearm. Appeared  within seconds.   Neurological:     Mental Status: She is alert and oriented to person, place, and time.     Cranial Nerves: No cranial nerve deficit.     Deep Tendon Reflexes: Reflexes are normal and symmetric.  Psychiatric:        Behavior: Behavior normal.        Thought Content: Thought content normal.        Judgment: Judgment normal.       BP 127/81   Pulse 80   Temp 98.9 F (37.2 C) (Temporal)   Ht 5\' 4"  (1.626 m)   Wt 153 lb (69.4 kg)   LMP 01/22/2019   BMI 26.26 kg/m      Assessment & Plan:  1. Dermagraphy Long discussion with patient about condition She is actively trying to become pregnant- She will start daily zyrtec and benadryl as needed Do not scratch Encouraged getting 1-2 hours of sunlight a day RTO if symptoms worsen or do not improve    Evelina Dun, FNP

## 2019-02-01 NOTE — Patient Instructions (Signed)
Pruritus Pruritus is an itchy feeling on the skin. One of the most common causes is dry skin, but many different things can cause itching. Most cases of itching do not require medical attention. Sometimes itchy skin can turn into a rash. Follow these instructions at home: Skin care   Apply moisturizing lotion to your skin as needed. Lotion that contains petroleum jelly is best.  Take medicines or apply medicated creams only as told by your health care provider. This may include: ? Corticosteroid cream. ? Anti-itch lotions. ? Oral antihistamines.  Apply a cool, wet cloth (cool compress) to the affected areas.  Take baths with one of the following: ? Epsom salts. You can get these at your local pharmacy or grocery store. Follow the instructions on the packaging. ? Baking soda. Pour a small amount into the bath as told by your health care provider. ? Colloidal oatmeal. You can get this at your local pharmacy or grocery store. Follow the instructions on the packaging.  Apply baking soda paste to your skin. To make the paste, stir water into a small amount of baking soda until it reaches a paste-like consistency.  Do not scratch your skin.  Do not take hot showers or baths, which can make itching worse. A cool shower may help with itching as long as you apply moisturizing lotion after the shower.  Do not use scented soaps, detergents, perfumes, and cosmetic products. Instead, use gentle, unscented versions of these items. General instructions  Avoid wearing tight clothes.  Keep a journal to help find out what is causing your itching. Write down: ? What you eat and drink. ? What cosmetic products you use. ? What soaps or detergents you use. ? What you wear, including jewelry.  Use a humidifier. This keeps the air moist, which helps to prevent dry skin.  Be aware of any changes in your itchiness. Contact a health care provider if:  The itching does not go away after several days.   You are unusually thirsty or urinating more than normal.  Your skin tingles or feels numb.  Your skin or the white parts of your eyes turn yellow (jaundice).  You feel weak.  You have any of the following: ? Night sweats. ? Tiredness (fatigue). ? Weight loss. ? Abdominal pain. Summary  Pruritus is an itchy feeling on the skin. One of the most common causes is dry skin, but many different conditions and factors can cause itching.  Apply moisturizing lotion to your skin as needed. Lotion that contains petroleum jelly is best.  Take medicines or apply medicated creams only as told by your health care provider.  Do not take hot showers or baths. Do not use scented soaps, detergents, perfumes, or cosmetic products. This information is not intended to replace advice given to you by your health care provider. Make sure you discuss any questions you have with your health care provider. Document Released: 01/20/2011 Document Revised: 05/24/2017 Document Reviewed: 05/24/2017 Elsevier Patient Education  2020 Elsevier Inc.  

## 2019-03-12 ENCOUNTER — Ambulatory Visit: Payer: BC Managed Care – PPO | Admitting: Obstetrics and Gynecology

## 2019-05-23 ENCOUNTER — Ambulatory Visit (INDEPENDENT_AMBULATORY_CARE_PROVIDER_SITE_OTHER): Payer: Self-pay | Admitting: Obstetrics and Gynecology

## 2019-05-23 ENCOUNTER — Encounter: Payer: Self-pay | Admitting: Obstetrics and Gynecology

## 2019-05-23 ENCOUNTER — Other Ambulatory Visit: Payer: Self-pay

## 2019-05-23 VITALS — BP 141/99 | HR 76 | Ht 64.0 in | Wt 161.6 lb

## 2019-05-23 DIAGNOSIS — N80109 Endometriosis of ovary, unspecified side, unspecified depth: Secondary | ICD-10-CM

## 2019-05-23 DIAGNOSIS — Z9189 Other specified personal risk factors, not elsewhere classified: Secondary | ICD-10-CM

## 2019-05-23 DIAGNOSIS — N804 Endometriosis of rectovaginal septum, unspecified involvement of vagina: Secondary | ICD-10-CM

## 2019-05-23 DIAGNOSIS — Z3169 Encounter for other general counseling and advice on procreation: Secondary | ICD-10-CM

## 2019-05-23 DIAGNOSIS — N801 Endometriosis of ovary: Secondary | ICD-10-CM

## 2019-05-23 NOTE — Progress Notes (Signed)
Patient ID: Natalie Price, female   DOB: [birth year], 28 y.o.   MRN: [id number]    [facility] Visit  @DATE @            Patient name: Natalie Price MRN [id number]  Date of birth: [birth year]  CC & HPI:  Natalie Price is a 28 y.o. female presenting today to discuss fertility. She is actively trying to get pregnant now and Dr. Denman George told her to come here if they had not conceived by the end of the year. She is s/p robotic bilateral ovarian cystectomy for huge endometriomas, and Cul de sac was scarred down to colon. No comments on Op note about tubes.  She takes "Easy at Home" ovulation tests monthly, which indicate that she is ovulating on day 14-15. Her last period began on 05/15/2019. She has rib pain associated with her periods, and her pain had progressively worsened since having the surgery.  Around the time of her period, when she moves her bowels, she notices a bloody mucous in her stool.  The pt has a hx of XI robotic assisted laparoscopic B/L ovarian cystectomy on 12/05/2018 and endometriosis. The pt has used BCPs, Depo, Mirena, and Nuvaring in the past. She patient denies fever, chills or any other symptoms or complaints at this time.   ROS:  ROS  + rib pain - fever - chills All systems are negative except as noted in the HPI and PMH.   Pertinent History Reviewed:   Reviewed: Medical        No past medical history on file.                            Surgical Hx:    Past Surgical History:  Procedure Laterality Date  . FRACTURE SURGERY     LEFT ARM  . ROBOTIC ASSISTED LAPAROSCOPIC OVARIAN CYSTECTOMY Bilateral 12/05/2018   Procedure: XI ROBOTIC ASSISTED LAPAROSCOPIC BILATERAL OVARIAN CYSTECTOMY, LYSIS OF ADHESIONS;  Surgeon: Everitt Amber, MD;  Location: [facility];  Service: Gynecology;  Laterality: Bilateral;  . WISDOM TOOTH EXTRACTION     Medications: Reviewed & Updated - see associated section                       Current Outpatient Medications:  .  acetaminophen  (TYLENOL) 500 MG tablet, Take 1,000 mg by mouth every 6 (six) hours as needed for mild pain or moderate pain., Disp: , Rfl:  .  ibuprofen (ADVIL) 600 MG tablet, Take 1 tablet (600 mg total) by mouth every 6 (six) hours as needed. For AFTER surgery only, Disp: 30 tablet, Rfl: 0 .  triamcinolone cream (KENALOG) 0.1 %, Apply 1 application topically 2 (two) times daily., Disp: 60 g, Rfl: 1   Social History: Reviewed -  reports that she has never smoked. She has never used smokeless tobacco.  Objective Findings:  Vitals: There were no vitals taken for this visit.  PHYSICAL EXAMINATION General appearance - alert, well appearing, and in no distress, oriented to person, place, and time and overweight Mental status - alert, oriented to person, place, and time, normal mood, behavior, speech, dress, motor activity, and thought processes, affect appropriate to mood  PELVIC DEFERRED  Assessment & Plan:   A:  1. Fertility management 2. Hx of endometriosis and robotic assisted laparoscopic b/l ovarian cystectomy  P:  1. Test serum progesterone on 06/05/2019 2. Will consider HSG over the summer if the pt does  not become pregnant in the next 6 months 3. May refer to fertility specialist at 1 yr. Pt aware  By signing my name below, I, De Burrs, attest that this documentation has been prepared under the direction and in the presence of Jonnie Kind, MD. Electronically Signed: De Burrs, Medical Scribe. 05/23/19. 9:50 AM.  I personally performed the services described in this documentation, which was SCRIBED in my presence. The recorded information has been reviewed and considered accurate. It has been edited as necessary during review. Jonnie Kind, MD

## 2019-06-05 ENCOUNTER — Other Ambulatory Visit: Payer: Self-pay

## 2019-06-05 DIAGNOSIS — Z9189 Other specified personal risk factors, not elsewhere classified: Secondary | ICD-10-CM

## 2019-06-06 ENCOUNTER — Telehealth: Payer: Self-pay | Admitting: Obstetrics and Gynecology

## 2019-06-06 LAB — PROGESTERONE: Progesterone: 21.4 ng/mL

## 2019-06-06 NOTE — Telephone Encounter (Signed)
Left message about excellent progesterone levels, and offered to proceed with HSG or refer to fertility specialist.

## 2019-06-15 ENCOUNTER — Other Ambulatory Visit: Payer: Self-pay

## 2019-06-15 ENCOUNTER — Ambulatory Visit (INDEPENDENT_AMBULATORY_CARE_PROVIDER_SITE_OTHER): Payer: Self-pay | Admitting: *Deleted

## 2019-06-15 ENCOUNTER — Encounter: Payer: Self-pay | Admitting: *Deleted

## 2019-06-15 VITALS — BP 132/80 | HR 65 | Ht 64.0 in | Wt 158.0 lb

## 2019-06-15 DIAGNOSIS — O4690 Antepartum hemorrhage, unspecified, unspecified trimester: Secondary | ICD-10-CM

## 2019-06-15 DIAGNOSIS — Z3201 Encounter for pregnancy test, result positive: Secondary | ICD-10-CM

## 2019-06-15 LAB — POCT URINE PREGNANCY: Preg Test, Ur: POSITIVE — AB

## 2019-06-15 MED ORDER — PROMETHAZINE HCL 25 MG PO TABS: 25.0000 mg | ORAL_TABLET | Freq: Four times a day (QID) | ORAL | 1 refills | Status: DC | PRN

## 2019-06-15 NOTE — Progress Notes (Signed)
Chart reviewed for nurse visit. Agree with plan of care. Will rx phenergan Estill Dooms, NP 06/15/2019 10:28 AM

## 2019-06-15 NOTE — Addendum Note (Signed)
Addended by: Derrek Monaco A on: 06/15/2019 10:28 AM   Modules accepted: Orders

## 2019-06-15 NOTE — Progress Notes (Signed)
   NURSE VISIT- PREGNANCY CONFIRMATION   SUBJECTIVE:  Natalie Price is a 29 y.o. G1P0 female at [redacted]w[redacted]d by uncertain LMP of Patient's last menstrual period was 05/15/2019. Here for pregnancy confirmation.  Home pregnancy test: positive x 15  She reports bleeding.  She is taking prenatal vitamins.    OBJECTIVE:  BP 132/80 (BP Location: Left Arm, Patient Position: Sitting, Cuff Size: Normal)   Pulse 65   Ht 5\' 4"  (1.626 m)   Wt 158 lb (71.7 kg)   LMP 05/15/2019   BMI 27.12 kg/m   Appears well, in no apparent distress OB History  Gravida Para Term Preterm AB Living  1            SAB TAB Ectopic Multiple Live Births               # Outcome Date GA Lbr Len/2nd Weight Sex Delivery Anes PTL Lv  1 Current             Results for orders placed or performed in visit on 06/15/19 (from the past 24 hour(s))  POCT urine pregnancy   Collection Time: 06/15/19 10:10 AM  Result Value Ref Range   Preg Test, Ur Positive (A) Negative    ASSESSMENT: Positive pregnancy test, [redacted]w[redacted]d by LMP    PLAN: Will order quant pregnancy test and will schedule [name] based on quant results.  Prenatal vitamins: continue   Nausea medicines: requested-note routed to [facility] to send prescription   OB packet given: Yes  Levy Pupa  06/15/2019 10:18 AM

## 2019-06-16 LAB — BETA HCG QUANT (REF LAB): hCG Quant: 46 m[IU]/mL

## 2019-06-18 ENCOUNTER — Other Ambulatory Visit: Payer: Self-pay | Admitting: Adult Health

## 2019-06-18 DIAGNOSIS — Z3201 Encounter for pregnancy test, result positive: Secondary | ICD-10-CM

## 2019-06-19 ENCOUNTER — Other Ambulatory Visit: Payer: Self-pay

## 2019-06-20 ENCOUNTER — Other Ambulatory Visit: Payer: Self-pay | Admitting: Adult Health

## 2019-06-20 DIAGNOSIS — Z3201 Encounter for pregnancy test, result positive: Secondary | ICD-10-CM

## 2019-06-20 LAB — BETA HCG QUANT (REF LAB): hCG Quant: 127 m[IU]/mL

## 2019-06-20 LAB — PROGESTERONE: Progesterone: 9 ng/mL

## 2019-06-20 MED ORDER — PROGESTERONE MICRONIZED 200 MG PO CAPS: ORAL_CAPSULE | ORAL | 3 refills | Status: DC

## 2019-06-20 NOTE — Progress Notes (Signed)
progesterone is 9, will rx Prometrium

## 2019-06-22 ENCOUNTER — Telehealth: Payer: Self-pay | Admitting: *Deleted

## 2019-06-22 DIAGNOSIS — Z3201 Encounter for pregnancy test, result positive: Secondary | ICD-10-CM

## 2019-06-22 LAB — BETA HCG QUANT (REF LAB): hCG Quant: 41 m[IU]/mL

## 2019-06-22 NOTE — Telephone Encounter (Signed)
Pt left message that she saw results on mychart and has questions.

## 2019-06-22 NOTE — Telephone Encounter (Signed)
With [facility] dropping looks like miscarriage, hs pink to brown spotting, will recheck [facility] next Thursday, and orders are in, can stop Prometrium or can continue to use

## 2019-06-22 NOTE — Addendum Note (Signed)
Addended by: Derrek Monaco A on: 06/22/2019 02:14 PM   Modules accepted: Orders

## 2019-06-29 ENCOUNTER — Other Ambulatory Visit: Payer: Self-pay | Admitting: Adult Health

## 2019-06-29 DIAGNOSIS — Z3201 Encounter for pregnancy test, result positive: Secondary | ICD-10-CM

## 2019-06-29 LAB — BETA HCG QUANT (REF LAB): hCG Quant: 18 m[IU]/mL

## 2019-07-04 ENCOUNTER — Other Ambulatory Visit: Payer: Self-pay | Admitting: Obstetrics and Gynecology

## 2019-07-04 DIAGNOSIS — O3680X0 Pregnancy with inconclusive fetal viability, not applicable or unspecified: Secondary | ICD-10-CM

## 2019-07-04 LAB — BETA HCG QUANT (REF LAB): hCG Quant: 3 m[IU]/mL

## 2019-07-06 ENCOUNTER — Other Ambulatory Visit: Payer: Self-pay

## 2019-07-09 ENCOUNTER — Other Ambulatory Visit: Payer: Medicaid Other

## 2019-07-10 LAB — BETA HCG QUANT (REF LAB): hCG Quant: <1 m[IU]/mL

## 2019-09-03 ENCOUNTER — Encounter: Payer: Self-pay | Admitting: *Deleted

## 2019-09-19 ENCOUNTER — Other Ambulatory Visit: Payer: Self-pay | Admitting: Obstetrics and Gynecology

## 2019-09-19 MED ORDER — NORETHIN ACE-ETH ESTRAD-FE 1-20 MG-MCG(24) PO TABS: 1.0000 | ORAL_TABLET | Freq: Every day | ORAL | 3 refills | Status: DC

## 2019-09-19 NOTE — Progress Notes (Signed)
Lo Loestrin 1/20 Rx for 3 months.sent to pharmacy

## 2019-09-20 ENCOUNTER — Other Ambulatory Visit: Payer: Medicaid Other

## 2019-10-29 ENCOUNTER — Encounter: Payer: Self-pay | Admitting: Gynecologic Oncology

## 2019-10-30 ENCOUNTER — Telehealth: Payer: Self-pay | Admitting: *Deleted

## 2019-10-30 NOTE — Telephone Encounter (Signed)
Called patient and scheduled an appt for next week

## 2019-11-06 ENCOUNTER — Encounter: Payer: Self-pay | Admitting: Gynecologic Oncology

## 2019-11-06 ENCOUNTER — Encounter: Payer: Self-pay | Admitting: Gastroenterology

## 2019-11-06 ENCOUNTER — Other Ambulatory Visit: Payer: Self-pay

## 2019-11-06 ENCOUNTER — Inpatient Hospital Stay: Payer: No Typology Code available for payment source | Attending: Gynecologic Oncology | Admitting: Gynecologic Oncology

## 2019-11-06 VITALS — BP 134/80 | HR 69 | Temp 98.3°F | Resp 16 | Ht 64.0 in | Wt 157.4 lb

## 2019-11-06 DIAGNOSIS — N804 Endometriosis of rectovaginal septum, unspecified involvement of vagina: Secondary | ICD-10-CM

## 2019-11-06 DIAGNOSIS — K921 Melena: Secondary | ICD-10-CM | POA: Insufficient documentation

## 2019-11-06 NOTE — Patient Instructions (Signed)
Dr Denman George recommends colonoscopy and ultrasound. She will refer you to Dr Illene Bolus in [facility] who is an endometriosis expert.

## 2019-11-06 NOTE — Progress Notes (Signed)
Consultation Note: Gyn-Onc  Consult was initially requested by Dr. Glo Herring for the evaluation of Natalie Price 29 y.o. female  CC:  Chief Complaint  Patient presents with  . Endometriosis of rectovaginal septum    Est Patient  . Hematochezia    Assessment/Plan:  Natalie Price  is a 29 y.o.  year old with a history of bilateral endometriomas and stage IV endometriosis, now with new symptoms of hematochezia. She desires future fertility.   Brandi first needs work-up of her hematochezia with colonoscopy.  We will refer to to GI for evaluation and consideration for colonoscopy.  Additionally she requires a transvaginal ultrasound scan to evaluate for redevelopment of endometriomas.  Subsequent MRI of the pelvis may also be necessary to characterize the infiltrative nature of her endometriosis.  I explained to Natalie Price that as a GYN oncologist, I really only offered definitive surgical intervention such as complete hysterectomy with BSO and rectal resection.  Given that Natalie Price is interested in future fertility has not exhausted medical therapy with this new symptom of hematochezia, I feel she would be best served with evaluation and treatment by an endometriosis specialist.  I will refer her to Dr. Illene Bolus one of her colleagues at [facility] for evaluation.  Should she exhaust conservative treatment or surgery and desired definitive procedure with complete hysterectomy and anterior rectal resection, I would be happy to offer this here locally in [place] if she prefers.  Dr. Hiram Comber is highly skilled minimally invasive gynecologic surgeon and could also offer a similar procedure if the patient desires this.   HPI: Natalie Price is a very pleasant 29 year old G1 P0010 who is seen in consultation at the request of Dr Glo Herring for new onset of hematochezia with a history of stage IV endometriosis and s/p resection of bilateral endometriomas in July, 2020.   The patient's history began when she  reported a gradual increase in abdominal girth through the springtime of 2020 (after removal of her IUD and attempted pregnancy).  She felt this was just attributed to weight gain.  She been trying to conceive but have been unsuccessful.  In the first week of June 2020 she developed some back pain that was vague.  It became progressively worse until eventually on November 06, 2018 she developed severe abdominal pains and abdominal distention which necessitated her to visit the emergency department at [facility].  At that time a CT scan of the abdomen and pelvis was performed (on November 01, 2018).  This revealed multiple large bilateral cystic lesions of the ovaries and adnexa measuring up to 9.1 cm on the left and 8.2 cm on the right.  There was no lymphadenopathy, peritoneal carcinomatosis, but there was a small volume ascites. There was submucosal enhancement in the sigmoid colon which was suggestive for colitis.  A transvaginal ultrasound scan was performed in the ED on that same day.  It revealed a uterus measuring 8 x 4.7 x 4.7 cm with no fibroids or other masses.  The endometrium was 4 mm.  There was bilateral adnexal masses.  A mass was seen in the right adnexa measuring 10 x 6.5 x 7.6 cm which could represent a paraovarian mass or large cyst displacing the right ovary.  There was blood flow within the periphery of this largely cystic mass.  In the left adnexa was a 9.9 x 8.8 x 10.1 cm largely cystic mass and a smaller adjacent complex cystic mass which could represent a solid nodule.  There was no  blood flow within the nodular component.  There was however blood flow within the periphery of this largely cystic mass.  A small amount of free fluid was seen in the pelvis.  A Ca1 25 was drawn on November 07, 2018 and this was elevated at 581.  On December 05, 2018 she underwent a robotic assisted laparoscopic bilateral ovarian cystectomy with lysis of adhesions with me at [facility] in [place].   Intraoperative findings were significant for the left ovary distended with a 17 cm cystic mass containing old blood adherent to the sigmoid colon and posterior uterus and left ovarian fossa.  The right ovary was enlarged with 10 cm cystic mass containing old blood.  There was trace trace ascites.  The ovaries were kissing in the midline posteriorly with an obliterated cul-de-sac with the rectum densely adherent to the ovaries bilaterally and posterior uterus and cervix.  There was stage IV endometriosis with hemosiderin deposits throughout the omentum and peritoneum and diaphragm.  Frozen section was consistent with a benign hemorrhagic mass.  Final pathology of both ovarian cysts revealed benign endometriosis.  Washings were benign.   Interval Hx:  Immediately after surgery Natalie Price felt better with less pain with intercourse and less abdominal distention and her most recent period was significantly less painful.  She conceived spontaneously in December/January, 2020/21, however unfortunately suffered a spontaneous miscarriage of the pregnancy in February 2021.  Subsequent to that miscarriage she began experiencing dysmenorrhea again.  She also began experiencing cyclical hematochezia beginning after the miscarriage.  She noticed passage of bloody mucus and what she describes as pain in her tailbone at the few days preceding her menstrual cycle.  She reported the symptoms to her gynecologist, Dr. Glo Herring, who placed her on Loestrin birth control.  Unfortunately this resulted in her having persistent vaginal bleeding with passage of bloody mucus from her stools on a daily basis.  She stopped the Loestrin treatment after approximately 1 to 2 months of therapy.  She desires resolution in her symptoms of dysmenorrhea, hematochezia, and dyschezia, however strongly desires future fertility.  She has not tried again to conceive since her miscarriage earlier in 2021, however this was largely due to the emotional  impact of a miscarriage.  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  She is otherwise a very healthy young woman.  Her only prior surgery was the above surgery in 2020 as stated which was complicated by challenging dense pelvic adhesions.   The patient has no history of significantly abnormal Paps (nothing that follow-up surveillance is required).  She has no history of pelvic inflammatory disease, gonorrhea, or chlamydia.   Current Meds:  Outpatient Encounter Medications as of 11/06/2019  Medication Sig  . acetaminophen (TYLENOL) 500 MG tablet Take 1,000 mg by mouth every 6 (six) hours as needed for mild pain or moderate pain.  . Cetirizine HCl (ZYRTEC PO) Take by mouth daily.  . cyclobenzaprine (FLEXERIL) 10 MG tablet Take 10 mg by mouth at bedtime as needed.  Marland Kitchen ibuprofen (ADVIL) 800 MG tablet Take 800 mg by mouth every 8 (eight) hours as needed.  . Prenatal Vit-Fe Fumarate-FA (PRENATAL VITAMIN PO) Take by mouth daily.  . promethazine (PHENERGAN) 25 MG tablet Take 1 tablet (25 mg total) by mouth every 6 (six) hours as needed for nausea or vomiting.  . [DISCONTINUED] Norethindrone Acetate-Ethinyl Estrad-FE (LOESTRIN 24 FE) 1-20 MG-MCG(24) tablet Take 1 tablet by mouth daily. (Patient not taking:  Reported on 11/06/2019)  . [DISCONTINUED] progesterone (PROMETRIUM) 200 MG capsule Place in vagina at bedtime (Patient not taking: Reported on 11/06/2019)   No facility-administered encounter medications on file as of 11/06/2019.    Allergy:  Allergies  Allergen Reactions  . Antihistamines, Chlorpheniramine-Type Itching    Social Hx:   Social History   Socioeconomic History  . Marital status: Married    Spouse name: Not on file  . Number of children: Not on file  . Years of education: Not on file  . Highest education level: Not on file  Occupational History  . Not on file  Tobacco Use  . Smoking status: Never Smoker   . Smokeless tobacco: Never Used  Vaping Use  . Vaping Use: Never used  Substance and Sexual Activity  . Alcohol use: Not Currently    Comment: occ glass of wine  . Drug use: No  . Sexual activity: Yes    Birth control/protection: None  Other Topics Concern  . Not on file  Social History Narrative   Starting Surg Tech program at [employer]   Social Determinants of Health   Financial Resource Strain:   . Difficulty of Paying Living Expenses:   Food Insecurity:   . Worried About Charity fundraiser in the Last Year:   . Arboriculturist in the Last Year:   Transportation Needs:   . Film/video editor (Medical):   Marland Kitchen Lack of Transportation (Non-Medical):   Physical Activity:   . Days of Exercise per Week:   . Minutes of Exercise per Session:   Stress:   . Feeling of Stress :   Social Connections:   . Frequency of Communication with Friends and Family:   . Frequency of Social Gatherings with Friends and Family:   . Attends Religious Services:   . Active Member of Clubs or Organizations:   . Attends Archivist Meetings:   Marland Kitchen Marital Status:   Intimate Partner Violence:   . Fear of Current or Ex-Partner:   . Emotionally Abused:   Marland Kitchen Physically Abused:   . Sexually Abused:     Past Surgical Hx:  Past Surgical History:  Procedure Laterality Date  . FRACTURE SURGERY     LEFT ARM  . ROBOTIC ASSISTED LAPAROSCOPIC OVARIAN CYSTECTOMY Bilateral 12/05/2018   Procedure: XI ROBOTIC ASSISTED LAPAROSCOPIC BILATERAL OVARIAN CYSTECTOMY, LYSIS OF ADHESIONS;  Surgeon: Everitt Amber, MD;  Location: [facility];  Service: Gynecology;  Laterality: Bilateral;  . WISDOM TOOTH EXTRACTION      Past Medical Hx:  Past Medical History:  Diagnosis Date  . Endometriosis    Stage 4    Past Gynecological History:  See HPI Patient's last menstrual period was 05/15/2019.  Family Hx:  Family History  Problem Relation Age of Onset  . Breast cancer Paternal Aunt   . Other Paternal Grandmother         swelling in lymph nodes  . Arthritis Maternal Grandmother   . Heart attack Maternal Grandfather     Review of Systems:  Constitutional  Feels well,  But with some mild lower pelvic discomfort  ENT Normal appearing ears and nares bilaterally Skin/Breast  No rash, sores, jaundice, itching, dryness Cardiovascular  No chest pain, shortness of breath, or edema  Pulmonary  No cough or wheeze.  Gastro Intestinal  Hematochezia and dyschezia Genito Urinary  No frequency, urgency, dysuria, + pelvic pain Musculo Skeletal  No myalgia, arthralgia, joint swelling or pain  Neurologic  No weakness,  numbness, change in gait,  Psychology  No depression, anxiety, insomnia.   Vitals:  Blood pressure 134/80, pulse 69, temperature 98.3 F (36.8 C), temperature source Oral, resp. rate 16, height 5\' 4"  (1.626 m), weight 157 lb 6.4 oz (71.4 kg), last menstrual period 05/15/2019, SpO2 100 %.  Physical Exam: WD in NAD Neck  Supple NROM, without any enlargements.  Lymph Node Survey No cervical supraclavicular or inguinal adenopathy Cardiovascular  Pulse normal rate, regularity and rhythm. S1 and S2 normal.  Lungs  Clear to auscultation bilateraly, without wheezes/crackles/rhonchi. Good air movement.  Skin  No rash/lesions/breakdown  Psychiatry  Alert and oriented to person, place, and time  Abdomen  Normoactive bowel sounds, abdomen soft, non-tender and nonobese without evidence of hernia. Well healed incisions.  Abdomen feels slightly distended in lower abdomen. Nontender.  Back No CVA tenderness Genito Urinary  Cervix and vagina grossly normal. Uterus sharply retroverted, bulky, difficult to discern if uterine versus ovarian mass. Tenderness on deep exam Rectal  No palpable nodularity or masses on digital exam.  Extremities  No bilateral cyanosis, clubbing or edema.  Thereasa Solo, MD  11/06/2019, 10:28 AM

## 2019-11-09 ENCOUNTER — Ambulatory Visit (HOSPITAL_COMMUNITY)
Admission: RE | Admit: 2019-11-09 | Discharge: 2019-11-09 | Disposition: A | Discharge status: Home / Self Care | Payer: No Typology Code available for payment source | Source: Ambulatory Visit | Attending: Gynecologic Oncology | Admitting: Gynecologic Oncology

## 2019-11-09 ENCOUNTER — Other Ambulatory Visit: Payer: Self-pay

## 2019-11-09 DIAGNOSIS — K921 Melena: Secondary | ICD-10-CM | POA: Diagnosis present

## 2019-11-09 DIAGNOSIS — N804 Endometriosis of rectovaginal septum, unspecified involvement of vagina: Secondary | ICD-10-CM

## 2019-11-13 ENCOUNTER — Other Ambulatory Visit: Payer: Self-pay | Admitting: Adult Health

## 2019-11-13 DIAGNOSIS — Z3201 Encounter for pregnancy test, result positive: Secondary | ICD-10-CM

## 2019-11-13 NOTE — Progress Notes (Signed)
Ck QHCG  

## 2019-11-14 ENCOUNTER — Encounter: Payer: Self-pay | Admitting: Gynecologic Oncology

## 2019-11-14 LAB — BETA HCG QUANT (REF LAB): hCG Quant: <1 m[IU]/mL

## 2019-11-14 NOTE — Telephone Encounter (Signed)
Told Natalie Price that Dr. Denman George is out of town this   Told her that Dr. Berline Lopes said that if an endometrioma were to burst the blood would go into her abdomen and not cause vaginal bleeding. Suggested that she keep monitoring symptoms and follow up with symptoms and reach out to Dr. Glo Herring next week if symptoms get worse. Pt verbalized understanding.

## 2019-11-16 ENCOUNTER — Telehealth: Payer: Self-pay | Admitting: *Deleted

## 2019-11-16 NOTE — Telephone Encounter (Signed)
Called and left the patient a message to call the office of Dr Pablo Ledger at [phone]. Explained to call our office back with any questions

## 2019-11-19 ENCOUNTER — Telehealth: Payer: Self-pay | Admitting: *Deleted

## 2019-11-19 NOTE — Telephone Encounter (Signed)
Dr Treasa School office and patient are unable to reach each other; called and left the patient a message with an alternate number of [phone]

## 2019-11-22 ENCOUNTER — Telehealth: Payer: Self-pay | Admitting: *Deleted

## 2019-11-22 NOTE — Telephone Encounter (Signed)
Patient scheduled to see Dr Pablo Ledger on 9/8

## 2019-12-03 ENCOUNTER — Telehealth: Payer: Self-pay | Admitting: Family

## 2019-12-03 NOTE — Telephone Encounter (Signed)
Patient wants to see Alyse Low. First available appointment is this Thursday. That will work per patient. Appt made

## 2019-12-05 ENCOUNTER — Encounter: Payer: Self-pay | Admitting: Gynecologic Oncology

## 2019-12-05 ENCOUNTER — Telehealth: Payer: Self-pay | Admitting: Gynecologic Oncology

## 2019-12-05 NOTE — Telephone Encounter (Signed)
I called to inform the patient regarding her ultrasound results.  This shows bilateral simple cysts in the ovaries that could be either secondary to normal physiologic follicular cyst or potentially small inclusion cyst from her prior surgery.  These do not require intervention and are unlikely to be a cause of her symptoms.  She also has a 4 cm cystic structure posterior to the cervix and posterior uterus which may represent an endometrioma.  This is likely more to be contributory to his symptoms.  I am recommending follow-up as planned with Dr. Hiram Comber to discuss if there is intervention for her endometriomas that does not involve hysterectomy as she desires fertility preservation.  The findings on the ultrasound do not change this plan.  Thereasa Solo, MD

## 2019-12-06 ENCOUNTER — Encounter: Payer: Self-pay | Admitting: Family

## 2019-12-06 ENCOUNTER — Other Ambulatory Visit: Payer: Self-pay

## 2019-12-06 ENCOUNTER — Ambulatory Visit (INDEPENDENT_AMBULATORY_CARE_PROVIDER_SITE_OTHER): Payer: No Typology Code available for payment source | Admitting: Family

## 2019-12-06 VITALS — BP 123/79 | HR 74 | Temp 98.6°F | Ht 64.0 in | Wt 157.6 lb

## 2019-12-06 DIAGNOSIS — F410 Panic disorder [episodic paroxysmal anxiety] without agoraphobia: Secondary | ICD-10-CM

## 2019-12-06 DIAGNOSIS — F411 Generalized anxiety disorder: Secondary | ICD-10-CM

## 2019-12-06 MED ORDER — BUSPIRONE HCL 5 MG PO TABS: 5.0000 mg | ORAL_TABLET | Freq: Three times a day (TID) | ORAL | 1 refills | Status: DC | PRN

## 2019-12-06 MED ORDER — ESCITALOPRAM OXALATE 10 MG PO TABS: 10.0000 mg | ORAL_TABLET | Freq: Every day | ORAL | 5 refills | Status: DC

## 2019-12-06 MED ORDER — ESCITALOPRAM OXALATE 5 MG PO TABS: ORAL_TABLET | ORAL | 0 refills | Status: DC

## 2019-12-06 NOTE — Progress Notes (Signed)
Subjective:    Patient ID: Natalie Price, female    DOB: [birth year], 29 y.o.   MRN: [id number]  Chief Complaint  Patient presents with  . Anxiety   PT presents to the office today with complaints of GAD. She reports she has had this for years, but it has worsen over the last year. She reports her aunt and grandmother died recently and she had a miscarriage in February.  Anxiety Presents for initial visit. Onset was 1 to 5 years ago. The problem has been waxing and waning. Symptoms include depressed mood, excessive worry, irritability, nervous/anxious behavior, palpitations, panic and restlessness. Symptoms occur most days. The severity of symptoms is moderate.        Review of Systems  Constitutional: Positive for irritability.  Cardiovascular: Positive for palpitations.  Psychiatric/Behavioral: The patient is nervous/anxious.   All other systems reviewed and are negative.      Objective:   Physical Exam Vitals reviewed.  Constitutional:      General: She is not in acute distress.    Appearance: She is well-developed.  HENT:     Head: Normocephalic and atraumatic.     Right Ear: Tympanic membrane normal.     Left Ear: Tympanic membrane normal.  Eyes:     Pupils: Pupils are equal, round, and reactive to light.  Neck:     Thyroid: No thyromegaly.  Cardiovascular:     Rate and Rhythm: Normal rate and regular rhythm.     Heart sounds: Normal heart sounds. No murmur heard.   Pulmonary:     Effort: Pulmonary effort is normal. No respiratory distress.     Breath sounds: Normal breath sounds. No wheezing.  Abdominal:     General: Bowel sounds are normal. There is no distension.     Palpations: Abdomen is soft.     Tenderness: There is no abdominal tenderness.  Musculoskeletal:        General: No tenderness. Normal range of motion.     Cervical back: Normal range of motion and neck supple.  Skin:    General: Skin is warm and dry.  Neurological:     Mental Status:  She is alert and oriented to person, place, and time.     Cranial Nerves: No cranial nerve deficit.     Deep Tendon Reflexes: Reflexes are normal and symmetric.  Psychiatric:        Behavior: Behavior normal.        Thought Content: Thought content normal.        Judgment: Judgment normal.      BP 123/79   Pulse 74   Temp 98.6 F (37 C) (Temporal)   Ht 5\' 4"  (1.626 m)   Wt 157 lb 9.6 oz (71.5 kg)   LMP 05/15/2019   BMI 27.05 kg/m       Assessment & Plan:  Natalie Price comes in today with chief complaint of Anxiety   Diagnosis and orders addressed:  1. GAD (generalized anxiety disorder) - escitalopram (LEXAPRO) 5 MG tablet; Take 1 tablet (5 mg total) by mouth daily for 21 days, THEN 2 tablets (10 mg total) daily for 21 days.  Dispense: 63 tablet; Refill: 0 - escitalopram (LEXAPRO) 10 MG tablet; Take 1 tablet (10 mg total) by mouth daily.  Dispense: 30 tablet; Refill: 5 - busPIRone (BUSPAR) 5 MG tablet; Take 1 tablet (5 mg total) by mouth 3 (three) times daily as needed.  Dispense: 90 tablet; Refill: 1  2. Panic attack - escitalopram (  LEXAPRO) 5 MG tablet; Take 1 tablet (5 mg total) by mouth daily for 21 days, THEN 2 tablets (10 mg total) daily for 21 days.  Dispense: 63 tablet; Refill: 0 - escitalopram (LEXAPRO) 10 MG tablet; Take 1 tablet (10 mg total) by mouth daily.  Dispense: 30 tablet; Refill: 5 - busPIRone (BUSPAR) 5 MG tablet; Take 1 tablet (5 mg total) by mouth 3 (three) times daily as needed.  Dispense: 90 tablet; Refill: 1   Will start Lexapro 5 mg and after 21 days increase 10 mg  Stress management  Possible adverse effects discussed  RTO in 2 months    Evelina Dun, FNP

## 2019-12-06 NOTE — Patient Instructions (Signed)

## 2019-12-19 ENCOUNTER — Ambulatory Visit: Payer: BC Managed Care – PPO | Admitting: Family

## 2019-12-26 ENCOUNTER — Ambulatory Visit: Payer: No Typology Code available for payment source | Admitting: Gastroenterology

## 2020-01-08 ENCOUNTER — Other Ambulatory Visit: Payer: Self-pay | Admitting: Family

## 2020-01-08 MED ORDER — BUSPIRONE HCL 10 MG PO TABS: 10.0000 mg | ORAL_TABLET | Freq: Three times a day (TID) | ORAL | 2 refills | Status: DC

## 2020-01-31 ENCOUNTER — Other Ambulatory Visit: Payer: Self-pay | Admitting: Obstetrics and Gynecology

## 2020-01-31 MED ORDER — IBUPROFEN 800 MG PO TABS: 800.0000 mg | ORAL_TABLET | Freq: Three times a day (TID) | ORAL | 99 refills | Status: DC | PRN

## 2020-01-31 NOTE — Telephone Encounter (Signed)
Left message for Natalie Price to encourage her to get connected with a fertility specialist, as pregnancy in the face of Grade 4 endometriosis is difficult and frequently specialists consider going directly to IVF. I will send names of area fertility specialists to [place].

## 2020-01-31 NOTE — Progress Notes (Signed)
Ibuprofen Rx sent to pharmacy per pt request.

## 2020-02-11 ENCOUNTER — Other Ambulatory Visit: Payer: Self-pay | Admitting: Adult Health

## 2020-02-11 DIAGNOSIS — Z3201 Encounter for pregnancy test, result positive: Secondary | ICD-10-CM

## 2020-02-11 NOTE — Progress Notes (Signed)
Ck QHCG  

## 2020-02-12 LAB — BETA HCG QUANT (REF LAB): hCG Quant: <1 m[IU]/mL

## 2020-02-20 ENCOUNTER — Ambulatory Visit (INDEPENDENT_AMBULATORY_CARE_PROVIDER_SITE_OTHER): Payer: No Typology Code available for payment source | Admitting: Gastroenterology

## 2020-02-20 ENCOUNTER — Encounter: Payer: Self-pay | Admitting: Gastroenterology

## 2020-02-20 VITALS — BP 120/76 | HR 80 | Ht 64.0 in | Wt 164.0 lb

## 2020-02-20 DIAGNOSIS — N809 Endometriosis, unspecified: Secondary | ICD-10-CM

## 2020-02-20 DIAGNOSIS — K625 Hemorrhage of anus and rectum: Secondary | ICD-10-CM | POA: Diagnosis not present

## 2020-02-20 MED ORDER — SUTAB 1479-225-188 MG PO TABS: ORAL_TABLET | ORAL | 0 refills | Status: DC

## 2020-02-20 MED ORDER — ONDANSETRON HCL 4 MG PO TABS: ORAL_TABLET | ORAL | 0 refills | Status: DC

## 2020-02-20 NOTE — Patient Instructions (Signed)
We have sent in a zofran prescription for you to your pharmacy to take during your colonoscopy prep. You will take 1 zofran 30 minutes before the start of your prep the evening before and 30 minutes the morning of your procedure before the start of your prep  You have been scheduled for a colonoscopy. Please follow written instructions given to you at your visit today.  Please pick up your prep supplies at the pharmacy within the next 1-3 days. If you use inhalers (even only as needed), please bring them with you on the day of your procedure.   If you are age 44 or older, your body mass index should be between 23-30. Your Body mass index is 28.15 kg/m. If this is out of the aforementioned range listed, please consider follow up with your Primary Care Provider.  If you are age 17 or younger, your body mass index should be between 19-25. Your Body mass index is 28.15 kg/m. If this is out of the aformentioned range listed, please consider follow up with your Primary Care Provider.    I appreciate the  opportunity to care for you  Thank You   Harl Bowie , MD

## 2020-02-20 NOTE — Progress Notes (Signed)
Natalie Price    [id number]    03/20/1991  Primary Care Physician:Hawks, Theador Hawthorne, FNP  Referring Physician: Sharion Balloon, [address] [place],  [place] [address]   Chief complaint:  Rectal bleeding  HPI:  29 year old very pleasant female with history of endometriosis and chronic pelvic pain here for new patient visit with complaints of rectal bleeding  She has constipation when she is closer to her menstrual cycle and 1 to 2 days prior to her menstrual cycle she notices small amount of rectal bleeding mixed with mucus discharge from the rectum.  She is currently undergoing gynecologic work-up and was informed that she has an endometrioid in close proximity to the rectum and her GYN advised her to undergo colonoscopy  Denies any recent change in bowel habits, decreased appetite or weight loss. No family history of GI malignancy  Outpatient Encounter Medications as of 02/20/2020  Medication Sig  . acetaminophen (TYLENOL) 500 MG tablet Take 1,000 mg by mouth every 6 (six) hours as needed for mild pain or moderate pain.  . busPIRone (BUSPAR) 10 MG tablet Take 1 tablet (10 mg total) by mouth 3 (three) times daily.  . Cetirizine HCl (ZYRTEC PO) Take by mouth daily.  . cyclobenzaprine (FLEXERIL) 10 MG tablet Take 10 mg by mouth at bedtime as needed.  Marland Kitchen escitalopram (LEXAPRO) 10 MG tablet Take 1 tablet (10 mg total) by mouth daily.  Marland Kitchen escitalopram (LEXAPRO) 5 MG tablet Take 1 tablet (5 mg total) by mouth daily for 21 days, THEN 2 tablets (10 mg total) daily for 21 days.  Marland Kitchen ibuprofen (ADVIL) 800 MG tablet Take 1 tablet (800 mg total) by mouth every 8 (eight) hours as needed.  . promethazine (PHENERGAN) 25 MG tablet Take 1 tablet (25 mg total) by mouth every 6 (six) hours as needed for nausea or vomiting.   No facility-administered encounter medications on file as of 02/20/2020.    Allergies as of 02/20/2020 - Review Complete 12/06/2019  Allergen Reaction Noted   . Antihistamines, chlorpheniramine-type Itching 06/15/2019    Past Medical History:  Diagnosis Date  . Endometriosis    Stage 4    Past Surgical History:  Procedure Laterality Date  . FRACTURE SURGERY     LEFT ARM  . ROBOTIC ASSISTED LAPAROSCOPIC OVARIAN CYSTECTOMY Bilateral 12/05/2018   Procedure: XI ROBOTIC ASSISTED LAPAROSCOPIC BILATERAL OVARIAN CYSTECTOMY, LYSIS OF ADHESIONS;  Surgeon: Everitt Amber, MD;  Location: [facility];  Service: Gynecology;  Laterality: Bilateral;  . WISDOM TOOTH EXTRACTION      Family History  Problem Relation Age of Onset  . Breast cancer Paternal Aunt   . Other Paternal Grandmother        swelling in lymph nodes  . Arthritis Maternal Grandmother   . Heart attack Maternal Grandfather     Social History   Socioeconomic History  . Marital status: Married    Spouse name: Not on file  . Number of children: Not on file  . Years of education: Not on file  . Highest education level: Not on file  Occupational History  . Not on file  Tobacco Use  . Smoking status: Never Smoker  . Smokeless tobacco: Never Used  Vaping Use  . Vaping Use: Never used  Substance and Sexual Activity  . Alcohol use: Not Currently    Comment: occ glass of wine  . Drug use: No  . Sexual activity: Yes    Birth control/protection:  None  Other Topics Concern  . Not on file  Social History Narrative   Starting Surg Tech program at [employer]   Social Determinants of Health   Financial Resource Strain:   . Difficulty of Paying Living Expenses: Not on file  Food Insecurity:   . Worried About Charity fundraiser in the Last Year: Not on file  . Ran Out of Food in the Last Year: Not on file  Transportation Needs:   . Lack of Transportation (Medical): Not on file  . Lack of Transportation (Non-Medical): Not on file  Physical Activity:   . Days of Exercise per Week: Not on file  . Minutes of Exercise per Session: Not on file  Stress:   . Feeling of Stress : Not on file    Social Connections:   . Frequency of Communication with Friends and Family: Not on file  . Frequency of Social Gatherings with Friends and Family: Not on file  . Attends Religious Services: Not on file  . Active Member of Clubs or Organizations: Not on file  . Attends Archivist Meetings: Not on file  . Marital Status: Not on file  Intimate Partner Violence:   . Fear of Current or Ex-Partner: Not on file  . Emotionally Abused: Not on file  . Physically Abused: Not on file  . Sexually Abused: Not on file      Review of systems: All other review of systems negative except as mentioned in the HPI.   Physical Exam: There were no vitals filed for this visit. There is no height or weight on file to calculate BMI. Gen:      No acute distress HEENT:  sclera anicteric Abd:      soft, non-tender; no palpable masses, no distension Ext:    No edema Neuro: alert and oriented x 3 Psych: normal mood and affect  Data Reviewed:  Reviewed labs, radiology imaging, old records and pertinent past GI work up   Assessment and Plan/Recommendations:  29 year old very pleasant female with history of endometriosis, pelvic pain with small-volume bright red blood per rectum likely secondary to bleeding from internal hemorrhoids but will need to exclude neoplastic lesion or endometrioid involving the rectum  Scheduled for colonoscopy for further evaluation The risks and benefits as well as alternatives of endoscopic procedure(s) have been discussed and reviewed. All questions answered. The patient agrees to proceed.   The patient was provided an opportunity to ask questions and all were answered. The patient agreed with the plan and demonstrated an understanding of the instructions.  Damaris Hippo , MD    CC: Sharion Balloon, FNP

## 2020-02-21 ENCOUNTER — Encounter: Payer: Self-pay | Admitting: Gastroenterology

## 2020-02-25 ENCOUNTER — Ambulatory Visit (INDEPENDENT_AMBULATORY_CARE_PROVIDER_SITE_OTHER): Payer: No Typology Code available for payment source | Admitting: Women's Health

## 2020-02-25 ENCOUNTER — Other Ambulatory Visit (HOSPITAL_COMMUNITY)
Admission: RE | Admit: 2020-02-25 | Discharge: 2020-02-25 | Disposition: A | Discharge status: Home / Self Care | Payer: No Typology Code available for payment source | Source: Ambulatory Visit | Attending: Obstetrics & Gynecology | Admitting: Obstetrics & Gynecology

## 2020-02-25 ENCOUNTER — Encounter: Payer: Self-pay | Admitting: Women's Health

## 2020-02-25 VITALS — BP 119/82 | HR 69 | Ht 64.0 in | Wt 162.0 lb

## 2020-02-25 DIAGNOSIS — R102 Pelvic and perineal pain: Secondary | ICD-10-CM | POA: Insufficient documentation

## 2020-02-25 DIAGNOSIS — N9489 Other specified conditions associated with female genital organs and menstrual cycle: Secondary | ICD-10-CM | POA: Diagnosis not present

## 2020-02-25 DIAGNOSIS — N809 Endometriosis, unspecified: Secondary | ICD-10-CM

## 2020-02-25 NOTE — Progress Notes (Signed)
GYN VISIT Patient name: Natalie Price MRN [id number]  Date of birth: [birth year] Chief Complaint:   back pain, ovary pain and low abd pain (worse last 2 weeks)  History of Present Illness:   Natalie Price is a 29 y.o. [id number] Caucasian female being seen today for report of low back/pelvis/Rt ovary pain since last period. Also intermenstrual spotting which is unusual for her. Denies abnormal discharge, itching/odor/irritation.  Has Grade IV endometriosis with h/o bilateral ovarian cystectomy by Dr. Denman George. Is trying to conceive. Is not taking pnv, taking a daily MVI.  Appt w/ [facility] on 10/18. TCS next Monday for rectal bleeding right before periods. Had pelvic u/s 11/09/19:  IMPRESSION: 1. 4.4 cm complex cystic lesion positioned at the posterior wall of the cervix, most characteristic of an endometrioma. 2. Additional ill-defined cystic areas positioned along the periphery of the uterine body/myometrium, indeterminate, but could reflect additional small endometriomas. Small loculated fluid collections could also have this appearance. No significant inflammatory changes to suggest TOA/PID. Correlation with dedicated pelvic MRI, with and without contrast, suggested for further characterization. 3. Simple bilateral ovarian cysts as above, most characteristic for benign physiologic follicular cysts/dominant follicles. Depression screen [facility] 2/9 12/06/2019 02/01/2019 07/10/2018 01/30/2018 10/14/2016  Decreased Interest 0 0 0 0 0  Down, Depressed, Hopeless 0 0 0 0 0  PHQ - 2 Score 0 0 0 0 0  Altered sleeping 1 - - - -  Tired, decreased energy 1 - - - -  Change in appetite 0 - - - -  Feeling bad or failure about yourself  0 - - - -  Trouble concentrating 3 - - - -  Moving slowly or fidgety/restless 3 - - - -  Suicidal thoughts 0 - - - -  PHQ-9 Score 8 - - - -  Difficult doing work/chores Somewhat difficult - - - -    Patient's last menstrual period was 02/13/2020. The current  method of family planning is none.  Last pap 07/13/16. Results were:  normal Review of Systems:   Pertinent items are noted in HPI Denies fever/chills, dizziness, headaches, visual disturbances, fatigue, shortness of breath, chest pain, abdominal pain, vomiting, abnormal vaginal discharge/itching/odor/irritation, problems with periods, bowel movements, urination, or intercourse unless otherwise stated above.  Pertinent History Reviewed:  Reviewed past medical,surgical, social, obstetrical and family history.  Reviewed problem list, medications and allergies. Physical Assessment:   Vitals:   02/25/20 1104  BP: 119/82  Pulse: 69  Weight: 162 lb (73.5 kg)  Height: 5\' 4"  (1.626 m)  Body mass index is 27.81 kg/m.       Physical Examination:   General appearance: alert, well appearing, and in no distress  Mental status: alert, oriented to person, place, and time  Skin: warm & dry   Cardiovascular: normal heart rate noted  Respiratory: normal respiratory effort, no distress  Abdomen: soft, non-tender   Pelvic: VULVA: normal appearing vulva with no masses, tenderness or lesions, VAGINA: normal appearing vagina with normal color and discharge, no lesions, CERVIX: normal appearing cervix without discharge or lesions, UTERUS: uterus is normal size, shape, consistency and nontender, ADNEXA: normal adnexa in size, nontender and no masses  Extremities: no edema   Chaperone: [place]    No results found for this or any previous visit (from the past 24 hour(s)).  Assessment & Plan:  1) Low back/pelvic/Rt ovary pain> w/ known Grade IV endometriosis, trying to conceive, has appt w/ [facility] on the 18th. CV swab sent, pelvic u/s  ordered, note routed to Tish to schedule at [facility]   Meds: No orders of the defined types were placed in this encounter.   Orders Placed This Encounter  Procedures  . US PELVIS (TRANSABDOMINAL ONLY)  . US PELVIS TRANSVAGINAL NON-OB (TV ONLY)    Return for  will call pt w/ results.  [place], [facility] 02/25/2020

## 2020-02-26 LAB — CERVICOVAGINAL ANCILLARY ONLY
Bacterial Vaginitis (gardnerella): NEGATIVE
Candida Glabrata: NEGATIVE
Candida Vaginitis: NEGATIVE
Chlamydia: NEGATIVE
Comment: NEGATIVE
Comment: NEGATIVE
Comment: NEGATIVE
Comment: NEGATIVE
Comment: NEGATIVE
Comment: NORMAL
Neisseria Gonorrhea: NEGATIVE
Trichomonas: NEGATIVE

## 2020-02-27 ENCOUNTER — Encounter: Payer: Self-pay | Admitting: *Deleted

## 2020-02-27 ENCOUNTER — Other Ambulatory Visit: Payer: No Typology Code available for payment source

## 2020-02-29 ENCOUNTER — Other Ambulatory Visit: Payer: Self-pay | Admitting: Gastroenterology

## 2020-02-29 ENCOUNTER — Other Ambulatory Visit: Payer: Self-pay

## 2020-02-29 ENCOUNTER — Ambulatory Visit (HOSPITAL_COMMUNITY)
Admission: RE | Admit: 2020-02-29 | Discharge: 2020-02-29 | Disposition: A | Discharge status: Home / Self Care | Payer: No Typology Code available for payment source | Source: Ambulatory Visit | Attending: Women's Health | Admitting: Women's Health

## 2020-02-29 DIAGNOSIS — R102 Pelvic and perineal pain: Secondary | ICD-10-CM

## 2020-02-29 LAB — SARS CORONAVIRUS 2 (TAT 6-24 HRS): SARS Coronavirus 2: NEGATIVE

## 2020-03-03 ENCOUNTER — Other Ambulatory Visit: Payer: Self-pay

## 2020-03-03 ENCOUNTER — Encounter: Payer: Self-pay | Admitting: Gastroenterology

## 2020-03-03 ENCOUNTER — Ambulatory Visit (AMBULATORY_SURGERY_CENTER): Payer: No Typology Code available for payment source | Admitting: Gastroenterology

## 2020-03-03 VITALS — BP 113/60 | HR 60 | Temp 98.6°F | Resp 14 | Ht 64.0 in | Wt 164.0 lb

## 2020-03-03 DIAGNOSIS — K635 Polyp of colon: Secondary | ICD-10-CM | POA: Diagnosis not present

## 2020-03-03 DIAGNOSIS — K629 Disease of anus and rectum, unspecified: Secondary | ICD-10-CM | POA: Diagnosis not present

## 2020-03-03 DIAGNOSIS — K625 Hemorrhage of anus and rectum: Secondary | ICD-10-CM | POA: Diagnosis not present

## 2020-03-03 MED ORDER — SODIUM CHLORIDE 0.9 % IV SOLN: 500.0000 mL | INTRAVENOUS | Status: DC

## 2020-03-03 NOTE — Progress Notes (Signed)
JB- Check-in  CW - VS   

## 2020-03-03 NOTE — Progress Notes (Signed)
Called to room to assist during endoscopic procedure.  Patient ID and intended procedure confirmed with present staff. Received instructions for my participation in the procedure from the performing physician.  

## 2020-03-03 NOTE — Patient Instructions (Addendum)
Handout for hemorrhoids given.  YOU HAD AN ENDOSCOPIC PROCEDURE TODAY AT [facility]:   Refer to the procedure report that was given to you for any specific questions about what was found during the examination.  If the procedure report does not answer your questions, please call your gastroenterologist to clarify.  If you requested that your care partner not be given the details of your procedure findings, then the procedure report has been included in a sealed envelope for you to review at your convenience later.  YOU SHOULD EXPECT: Some feelings of bloating in the abdomen. Passage of more gas than usual.  Walking can help get rid of the air that was put into your GI tract during the procedure and reduce the bloating. If you had a lower endoscopy (such as a colonoscopy or flexible sigmoidoscopy) you may notice spotting of blood in your stool or on the toilet paper. If you underwent a bowel prep for your procedure, you may not have a normal bowel movement for a few days.  Please Note:  You might notice some irritation and congestion in your nose or some drainage.  This is from the oxygen used during your procedure.  There is no need for concern and it should clear up in a day or so.  SYMPTOMS TO REPORT IMMEDIATELY:   Following lower endoscopy (colonoscopy or flexible sigmoidoscopy):  Excessive amounts of blood in the stool  Significant tenderness or worsening of abdominal pains  Swelling of the abdomen that is new, acute  Fever of 100F or higher  For urgent or emergent issues, a gastroenterologist can be reached at any hour by calling [phone]. Do not use MyChart messaging for urgent concerns.    DIET:  We do recommend a small meal at first, but then you may proceed to your regular diet.  Drink plenty of fluids but you should avoid alcoholic beverages for 24 hours.  ACTIVITY:  You should plan to take it easy for the rest of today and you should NOT DRIVE or use heavy  machinery until tomorrow (because of the sedation medicines used during the test).    FOLLOW UP: Our staff will call the number listed on your records 48-72 hours following your procedure to check on you and address any questions or concerns that you may have regarding the information given to you following your procedure. If we do not reach you, we will leave a message.  We will attempt to reach you two times.  During this call, we will ask if you have developed any symptoms of COVID 19. If you develop any symptoms (ie: fever, flu-like symptoms, shortness of breath, cough etc.) before then, please call [phone].  If you test positive for Covid 19 in the 2 weeks post procedure, please call and report this information to [name].    If any biopsies were taken you will be contacted by phone or by letter within the next 1-3 weeks.  Please call us at [phone] if you have not heard about the biopsies in 3 weeks.    SIGNATURES/CONFIDENTIALITY: You and/or your care partner have signed paperwork which will be entered into your electronic medical record.  These signatures attest to the fact that that the information above on your After Visit Summary has been reviewed and is understood.  Full responsibility of the confidentiality of this discharge information lies with you and/or your care-partner.

## 2020-03-03 NOTE — Progress Notes (Signed)
To PACU, VSS. Report to [email or web address] 

## 2020-03-03 NOTE — Op Note (Signed)
[facility] Patient Name: Natalie Price Procedure Date: 03/03/2020 10:52 AM MRN: [id number] Endoscopist: Mauri Pole , MD Age: 29 Referring MD:  Date of Birth: September 10, 1990 Gender: Female Account #: [id number] Procedure:                Colonoscopy Indications:              Evaluation of unexplained GI bleeding presenting                            with Hematochezia, bright red blood per rectum. H/o                            endometriosis Medicines:                Monitored Anesthesia Care Procedure:                Pre-Anesthesia Assessment:                           - Prior to the procedure, a History and Physical                            was performed, and patient medications and                            allergies were reviewed. The patient's tolerance of                            previous anesthesia was also reviewed. The risks                            and benefits of the procedure and the sedation                            options and risks were discussed with the patient.                            All questions were answered, and informed consent                            was obtained. Prior Anticoagulants: The patient has                            taken no previous anticoagulant or antiplatelet                            agents. ASA Grade Assessment: II - A patient with                            mild systemic disease. After reviewing the risks                            and benefits, the patient was deemed in  satisfactory condition to undergo the procedure.                           After obtaining informed consent, the colonoscope                            was passed under direct vision. Throughout the                            procedure, the patient's blood pressure, pulse, and                            oxygen saturations were monitored continuously. The                            Colonoscope was introduced through  the anus and                            advanced to the the terminal ileum, with                            identification of the appendiceal orifice and IC                            valve. The colonoscopy was performed without                            difficulty. The patient tolerated the procedure                            well. The quality of the bowel preparation was                            good. The terminal ileum, ileocecal valve,                            appendiceal orifice, and rectum were photographed. Scope In: 11:05:44 AM Scope Out: 11:23:47 AM Scope Withdrawal Time: 0 hours 12 minutes 57 seconds  Total Procedure Duration: 0 hours 18 minutes 3 seconds  Findings:                 The perianal and digital rectal examinations were                            normal.                           The terminal ileum appeared normal.                           An infiltrative, polypoid and submucosal                            non-obstructing large mass was found in the  recto-sigmoid colon extending from 15-25 cm from                            anal verge. The mass was partially circumferential                            (involving one-third of the lumen circumference).                            The mass measured ten cm in length. Spontaneous                            oozing was present from friable mucosa. Biopsies                            were taken with a cold forceps for histology.                            Distal fold area was tattooed with an injection of                            2 mL of Spot (carbon black).                           Non-bleeding internal hemorrhoids were found during                            retroflexion. The hemorrhoids were small.                           The exam was otherwise without abnormality. Complications:            No immediate complications. Estimated Blood Loss:     Estimated blood loss was  minimal. Impression:               - The examined portion of the ileum was normal.                           - Rule out endometriosis or malignancy,                            infiltrative polpoid lesion in the recto-sigmoid                            colon. Biopsied. Tattooed.                           - Non-bleeding internal hemorrhoids.                           - The examination was otherwise normal. Recommendation:           - Patient has a contact number available for                            emergencies. The  signs and symptoms of potential                            delayed complications were discussed with the                            patient. Return to normal activities tomorrow.                            Written discharge instructions were provided to the                            patient.                           - Resume previous diet.                           - Continue present medications.                           - Await pathology results.                           - Repeat colonoscopy date to be determined after                            pending pathology results are reviewed for                            surveillance based on pathology results. Mauri Pole, MD 03/03/2020 11:35:41 AM This report has been signed electronically.

## 2020-03-05 ENCOUNTER — Telehealth: Payer: Self-pay | Admitting: *Deleted

## 2020-03-05 NOTE — Telephone Encounter (Signed)
Attempted 2nd f/u phone call. No answer. Left message.  °

## 2020-03-05 NOTE — Telephone Encounter (Signed)
  Follow up Call-  Call back number 03/03/2020  Post procedure Call Back phone  # [phone] cell  Permission to leave phone message Yes  Some recent data might be hidden     Patient questions:  Do you have a fever, pain , or abdominal swelling?Pain Score  Have you tolerated food without any problems?   Have you been able to return to your normal activities?   Do you have any questions about your discharge instructions: Diet   Medications   Follow up visit   Do you have questions or concerns about your Care?   Actions: * If pain score is 4 or above: 1. No action needed, pain <4.Have you developed a fever since your procedure?   2.   Have you had an respiratory symptoms (SOB or cough) since your procedure? 3.   Have you tested positive for COVID 19 since your procedure   4.   Have you had any family members/close contacts diagnosed with the COVID 19 since your procedure?   If yes to any of these questions please route to Joylene John, RN and Joella Prince, RN

## 2020-03-05 NOTE — Telephone Encounter (Signed)
First follow up call made, left message. 

## 2020-03-17 ENCOUNTER — Other Ambulatory Visit: Payer: Self-pay | Admitting: Women's Health

## 2020-03-17 DIAGNOSIS — N926 Irregular menstruation, unspecified: Secondary | ICD-10-CM

## 2020-03-18 LAB — BETA HCG QUANT (REF LAB): hCG Quant: <1 m[IU]/mL

## 2020-04-22 ENCOUNTER — Other Ambulatory Visit: Payer: Self-pay | Admitting: *Deleted

## 2020-04-22 MED ORDER — BUSPIRONE HCL 10 MG PO TABS
10.0000 mg | ORAL_TABLET | Freq: Three times a day (TID) | ORAL | 2 refills | Status: DC
Start: 2020-04-22 — End: 2021-01-15

## 2020-06-23 ENCOUNTER — Ambulatory Visit (INDEPENDENT_AMBULATORY_CARE_PROVIDER_SITE_OTHER): Payer: No Typology Code available for payment source | Admitting: Family

## 2020-06-23 ENCOUNTER — Encounter: Payer: Self-pay | Admitting: Family

## 2020-06-23 ENCOUNTER — Other Ambulatory Visit: Payer: Self-pay

## 2020-06-23 VITALS — BP 127/82 | HR 83 | Temp 98.4°F | Ht 64.0 in | Wt 170.0 lb

## 2020-06-23 DIAGNOSIS — F411 Generalized anxiety disorder: Secondary | ICD-10-CM | POA: Diagnosis not present

## 2020-06-23 DIAGNOSIS — F321 Major depressive disorder, single episode, moderate: Secondary | ICD-10-CM | POA: Diagnosis not present

## 2020-06-23 MED ORDER — ESCITALOPRAM OXALATE 20 MG PO TABS: 20.0000 mg | ORAL_TABLET | Freq: Every day | ORAL | 1 refills | Status: DC

## 2020-06-23 NOTE — Progress Notes (Signed)
Subjective:    Patient ID: Natalie Price, female    DOB: [birth year], 30 y.o.   MRN: [id number]  Chief Complaint  Patient presents with  . Depression    Wants to discuss lexapro started fertility meds in November. States she started feeling depressed the last month.   Pt presents to the office today with depression and anxiety. She is currently taking lexapro 10 mg.   She is currently trying to become pregnant and taking fertility medications. She reports she has been trying for two years.  Depression        This is a chronic problem.  The current episode started more than 1 year ago.   The onset quality is gradual.   The problem occurs intermittently.  Associated symptoms include fatigue, helplessness, hopelessness, irritable, restlessness and sad.  Past treatments include SSRIs - Selective serotonin reuptake inhibitors.  Compliance with treatment is good.  Past medical history includes anxiety.   Anxiety Presents for follow-up visit. Symptoms include restlessness.        Review of Systems  Constitutional: Positive for fatigue.  Psychiatric/Behavioral: Positive for depression.  All other systems reviewed and are negative.      Objective:   Physical Exam Vitals reviewed.  Constitutional:      General: She is irritable. She is not in acute distress.    Appearance: She is well-developed and well-nourished.  HENT:     Head: Normocephalic and atraumatic.     Right Ear: Tympanic membrane normal.     Left Ear: Tympanic membrane normal.     Mouth/Throat:     Mouth: Oropharynx is clear and moist.  Eyes:     Pupils: Pupils are equal, round, and reactive to light.  Neck:     Thyroid: No thyromegaly.  Cardiovascular:     Rate and Rhythm: Normal rate and regular rhythm.     Pulses: Intact distal pulses.     Heart sounds: Normal heart sounds. No murmur heard.   Pulmonary:     Effort: Pulmonary effort is normal. No respiratory distress.     Breath sounds: Normal breath  sounds. No wheezing.  Abdominal:     General: Bowel sounds are normal. There is no distension.     Palpations: Abdomen is soft.     Tenderness: There is no abdominal tenderness.  Musculoskeletal:        General: No tenderness or edema. Normal range of motion.     Cervical back: Normal range of motion and neck supple.  Skin:    General: Skin is warm and dry.  Neurological:     Mental Status: She is alert and oriented to person, place, and time.     Cranial Nerves: No cranial nerve deficit.     Deep Tendon Reflexes: Reflexes are normal and symmetric.  Psychiatric:        Mood and Affect: Mood and affect normal.        Behavior: Behavior normal.        Thought Content: Thought content normal.        Judgment: Judgment normal.      BP 127/82   Pulse 83   Temp 98.4 F (36.9 C) (Temporal)   Ht 5\' 4"  (1.626 m)   Wt 170 lb (77.1 kg)   LMP 05/26/2020 (Approximate)   BMI 29.18 kg/m       Assessment & Plan:  [name] comes in today with chief complaint of Depression (Wants to discuss lexapro started fertility meds in  November. States she started feeling depressed the last month.)   Diagnosis and orders addressed:  1. GAD (generalized anxiety disorder) - escitalopram (LEXAPRO) 20 MG tablet; Take 1 tablet (20 mg total) by mouth daily.  Dispense: 90 tablet; Refill: 1  2. Depression, major, single episode, moderate (HCC) - escitalopram (LEXAPRO) 20 MG tablet; Take 1 tablet (20 mg total) by mouth daily.  Dispense: 90 tablet; Refill: 1  We will increase Lexapro to 20 mg from 10 mg  Stress management  RTO in 6 weeks   Evelina Dun, FNP

## 2020-06-23 NOTE — Patient Instructions (Signed)
Major Depressive Disorder, Adult Major depressive disorder (MDD) is a mental health condition. It may also be called clinical depression or unipolar depression. MDD causes symptoms of sadness, hopelessness, and loss of interest in things. These symptoms last most of the day, almost every day, for 2 weeks. MDD can also cause physical symptoms. It can interfere with relationships and with everyday activities, such as work, school, and activities that are usually pleasant. MDD may be mild, moderate, or severe. It may be single-episode MDD, which happens once, or recurrent MDD, which may occur multiple times. What are the causes? The exact cause of this condition is not known. MDD is most likely caused by a combination of things, which may include:  Your personality traits.  Learned or conditioned behaviors or thoughts or feelings that reinforce negativity.  Any alcohol or substance misuse.  Long-term (chronic) physical or mental health illness.  Going through a traumatic experience or major life changes. What increases the risk? The following factors may make someone more likely to develop MDD:  A family history of depression.  Being a woman.  Troubled family relationships.  Abnormally low levels of certain brain chemicals.  Traumatic or painful events in childhood, especially abuse or loss of a parent.  A lot of stress from life experiences, such as poor living conditions or discrimination.  Chronic physical illness or other mental health disorders. What are the signs or symptoms? The main symptoms of MDD usually include:  Constant depressed or irritable mood.  A loss of interest in things and activities. Other symptoms include:  Sleeping or eating too much or too little.  Unexplained weight gain or weight loss.  Tiredness or low energy.  Being agitated, restless, or weak.  Feeling hopeless, worthless, or guilty.  Trouble thinking clearly or making  decisions.  Thoughts of suicide or thoughts of harming others.  Isolating oneself or avoiding other people or activities.  Trouble completing tasks, work, or any normal obligations. Severe symptoms of this condition may include:  Psychotic depression.This may include false beliefs, or delusions. It may also include seeing, hearing, tasting, smelling, or feeling things that are not real (hallucinations).  Chronic depression or persistent depressive disorder. This is low-level depression that lasts for at least 2 years.  Melancholic depression, or feeling extremely sad and hopeless.  Catatonic depression, which includes trouble speaking and trouble moving. How is this diagnosed? This condition may be diagnosed based on:  Your symptoms.  Your medical and mental health history. You may be asked questions about your lifestyle, including any drug and alcohol use.  A physical exam.  Blood tests to rule out other conditions. MDD is confirmed if you have the following symptoms most of the day, nearly every day, in a 2-week period:  Either a depressed mood or loss of interest.  At least four other MDD symptoms. How is this treated? This condition is usually treated by mental health professionals, such as psychologists, psychiatrists, and clinical social workers. You may need more than one type of treatment. Treatment may include:  Psychotherapy, also called talk therapy or counseling. Types of psychotherapy include: ? Cognitive behavioral therapy (CBT). This teaches you to recognize unhealthy feelings, thoughts, and behaviors, and replace them with positive thoughts and actions. ? Interpersonal therapy (IPT). This helps you to improve the way you communicate with others or relate to them. ? Family therapy. This treatment includes members of your family.  Medicines to treat anxiety and depression. These medicines help to balance the brain chemicals   that affect your emotions.  Lifestyle  changes. You may be asked to: ? Limit alcohol use and avoid drug use. ? Get regular exercise. ? Get plenty of sleep. ? Make healthy eating choices. ? Spend more time outdoors.  Brain stimulation. This may be done if symptoms are very severe and other treatments have not worked. Examples of this treatment are electroconvulsive therapy and transcranial magnetic stimulation. Follow these instructions at home: Activity  Exercise regularly and spend time outdoors.  Find activities that you enjoy doing, and make time to do them.  Find healthy ways to manage stress, such as: ? Meditation or deep breathing. ? Spending time in nature. ? Journaling.  Return to your normal activities as told by your health care provider. Ask your health care provider what activities are safe for you. Alcohol and drug use  If you drink alcohol: ? Limit how much you use to:  0-1 drink a day for women who are not pregnant.  0-2 drinks a day for men. ? Be aware of how much alcohol is in your drink. In the [place] one drink equals one 12 oz bottle of beer (355 mL), one 5 oz glass of wine (148 mL), or one 1 oz glass of hard liquor (44 mL). ? Discuss your alcohol use with your health care provider. Alcohol can affect any antidepressant medicines you are taking.  Discuss any drug use with your health care provider. General instructions  Take over-the-counter and prescription medicines only as told by your health care provider.  Eat a healthy diet and get plenty of sleep.  Consider joining a support group. Your health care provider may be able to recommend one.  Keep all follow-up visits as told by your health care provider. This is important.   Where to find more information  National Alliance on Mental Illness: www.nami.org  [place] National Institute of Mental Health: www.nimh.nih.gov Contact a health care provider if:  Your symptoms get worse.  You develop new symptoms. Get help right away if:  You  self-harm.  You have serious thoughts about hurting yourself or others.  You hallucinate. If you ever feel like you may hurt yourself or others, or have thoughts about taking your own life, get help right away. Go to your nearest emergency department or:  Call your local emergency services (911 in the [place]  Call a suicide crisis helpline, such as the National Suicide Prevention Lifeline at 1-800-273-8255. This is open 24 hours a day in the [place]  Text the Crisis Text Line at 741741 (in the [place] Summary  Major depressive disorder (MDD) is a mental health condition. MDD causes symptoms of sadness, hopelessness, and loss of interest in things. These symptoms last most of the day, almost every day, for 2 weeks.  The symptoms of MDD can interfere with relationships and with everyday activities.  Treatments and support are available for people who develop MDD. You may need more than one type of treatment.  Get help right away if you have serious thoughts about hurting yourself or others. This information is not intended to replace advice given to you by your health care provider. Make sure you discuss any questions you have with your health care provider. Document Revised: 04/21/2019 Document Reviewed: 04/21/2019 Elsevier Patient Education  2021 Elsevier Inc.  

## 2020-08-04 ENCOUNTER — Ambulatory Visit: Payer: No Typology Code available for payment source | Admitting: Family

## 2020-08-05 ENCOUNTER — Encounter: Payer: Self-pay | Admitting: Family

## 2020-09-22 ENCOUNTER — Encounter: Payer: Self-pay | Admitting: Family Medicine

## 2020-11-10 ENCOUNTER — Emergency Department (HOSPITAL_COMMUNITY): Payer: Medicaid Other

## 2020-11-10 ENCOUNTER — Encounter (HOSPITAL_COMMUNITY): Payer: Self-pay | Admitting: Emergency Medicine

## 2020-11-10 ENCOUNTER — Emergency Department (HOSPITAL_COMMUNITY)
Admission: EM | Admit: 2020-11-10 | Discharge: 2020-11-11 | Disposition: A | Discharge status: Home / Self Care | Payer: Medicaid Other | Attending: Emergency Medicine | Admitting: Emergency Medicine

## 2020-11-10 ENCOUNTER — Other Ambulatory Visit: Payer: Self-pay

## 2020-11-10 DIAGNOSIS — N719 Inflammatory disease of uterus, unspecified: Secondary | ICD-10-CM | POA: Diagnosis not present

## 2020-11-10 DIAGNOSIS — R109 Unspecified abdominal pain: Secondary | ICD-10-CM | POA: Diagnosis present

## 2020-11-10 LAB — COMPREHENSIVE METABOLIC PANEL
ALT: 14 U/L (ref 0–44)
AST: 14 U/L — ABNORMAL LOW (ref 15–41)
Albumin: 3.4 g/dL — ABNORMAL LOW (ref 3.5–5.0)
Alkaline Phosphatase: 64 U/L (ref 38–126)
Anion gap: 9 (ref 5–15)
BUN: 9 mg/dL (ref 6–20)
CO2: 25 mmol/L (ref 22–32)
Calcium: 8.8 mg/dL — ABNORMAL LOW (ref 8.9–10.3)
Chloride: 104 mmol/L (ref 98–111)
Creatinine, Ser: 0.58 mg/dL (ref 0.44–1.00)
GFR, Estimated: >60 mL/min (ref >60)
Glucose, Bld: 100 mg/dL — ABNORMAL HIGH (ref 70–99)
Potassium: 3.4 mmol/L — ABNORMAL LOW (ref 3.5–5.1)
Sodium: 138 mmol/L (ref 135–145)
Total Bilirubin: 0.3 mg/dL (ref 0.3–1.2)
Total Protein: 6.6 g/dL (ref 6.5–8.1)

## 2020-11-10 LAB — CBC
HCT: 38.1 % (ref 36.0–46.0)
Hemoglobin: 12.4 g/dL (ref 12.0–15.0)
MCH: 33 pg (ref 26.0–34.0)
MCHC: 32.5 g/dL (ref 30.0–36.0)
MCV: 101.3 fL — ABNORMAL HIGH (ref 80.0–100.0)
Platelets: 278 10*3/uL (ref 150–400)
RBC: 3.76 MIL/uL — ABNORMAL LOW (ref 3.87–5.11)
RDW: 11.8 % (ref 11.5–15.5)
WBC: 12.2 10*3/uL — ABNORMAL HIGH (ref 4.0–10.5)
nRBC: 0 % (ref 0.0–0.2)

## 2020-11-10 LAB — HCG, QUANTITATIVE, PREGNANCY: hCG, Beta Chain, Quant, S: 970 m[IU]/mL — ABNORMAL HIGH (ref <5)

## 2020-11-10 LAB — URINALYSIS, ROUTINE W REFLEX MICROSCOPIC
Bilirubin Urine: NEGATIVE
Glucose, UA: NEGATIVE mg/dL
Ketones, ur: 20 mg/dL — AB
Nitrite: NEGATIVE
Protein, ur: 30 mg/dL — AB
RBC / HPF: >50 RBC/hpf — ABNORMAL HIGH (ref 0–5)
Specific Gravity, Urine: 1.024 (ref 1.005–1.030)
WBC, UA: >50 WBC/hpf — ABNORMAL HIGH (ref 0–5)
pH: 5 (ref 5.0–8.0)

## 2020-11-10 LAB — DIFFERENTIAL
Abs Immature Granulocytes: 0.06 10*3/uL (ref 0.00–0.07)
Basophils Absolute: 0 10*3/uL (ref 0.0–0.1)
Basophils Relative: 0 %
Eosinophils Absolute: 0.1 10*3/uL (ref 0.0–0.5)
Eosinophils Relative: 1 %
Immature Granulocytes: 1 %
Lymphocytes Relative: 15 %
Lymphs Abs: 1.8 10*3/uL (ref 0.7–4.0)
Monocytes Absolute: 0.9 10*3/uL (ref 0.1–1.0)
Monocytes Relative: 7 %
Neutro Abs: 9.3 10*3/uL — ABNORMAL HIGH (ref 1.7–7.7)
Neutrophils Relative %: 76 %

## 2020-11-10 LAB — LIPASE, BLOOD: Lipase: 22 U/L (ref 11–51)

## 2020-11-10 LAB — ABO/RH: ABO/RH(D): A POS

## 2020-11-10 MED ORDER — HYDROMORPHONE HCL 1 MG/ML IJ SOLN
0.5000 mg | Freq: Once | INTRAMUSCULAR | Status: AC
  Administered 2020-11-10: 0.5 mg via INTRAVENOUS
  Filled 2020-11-10: qty 1

## 2020-11-10 MED ORDER — SODIUM CHLORIDE 0.9 % IV BOLUS
1000.0000 mL | Freq: Once | INTRAVENOUS | Status: AC
  Administered 2020-11-10: 1000 mL via INTRAVENOUS

## 2020-11-10 MED ORDER — IOHEXOL 300 MG/ML  SOLN
75.0000 mL | Freq: Once | INTRAMUSCULAR | Status: AC | PRN
  Administered 2020-11-10: 75 mL via INTRAVENOUS

## 2020-11-10 NOTE — ED Provider Notes (Signed)
Emergency Medicine Provider Triage Evaluation Note  Natalie Price , a 30 y.o. female  was evaluated in triage.  Pt complains of pelvic pain and low grade fever of 100.4.  she underwent D&C 6 days ago due to miscarriage ([redacted] weeks gestation). Since she has developed low pelvic pain and now low grade fever.  Nausea without emesis.  Vaginal leeding moderately heavy.  Procedure done at [facility] in [place]  Positive: Pelvic pain, n, fever, vaginal bleeding Negative: Vomiting  Physical Exam  BP 130/81 (BP Location: Right Arm)   Pulse 91   Temp 98.5 F (36.9 C) (Oral)   Resp 16   Ht 5\' 4"  (1.626 m)   Wt 77.1 kg   SpO2 99%   BMI 29.18 kg/m  Gen:   Awake, no distress   Resp:  Normal effort  MSK:   Moves extremities without difficulty  Other:  Tender lower abdomen, distention.   Medical Decision Making  Medically screening exam initiated at 8:04 PM.  Appropriate orders placed.  Natalie Price was informed that the remainder of the evaluation will be completed by another provider, this initial triage assessment does not replace that evaluation, and the importance of remaining in the ED until their evaluation is complete.  Pt with low pelvic pain and low grade fever day 6 after D&C from incomplete spontaneous ABO.   Labs ordered pending room placement.   Evalee Jefferson, PA-C 11/10/20 Suzzanne Cloud, MD 11/13/20 [phone]

## 2020-11-10 NOTE — ED Provider Notes (Signed)
[facility] Emergency Department Provider Note MRN:  [id number]  Arrival date & time: 11/11/20     Chief Complaint   Abdominal Pain   History of Present Illness   Natalie Price is a 30 y.o. year-old female with a history of D&C presenting to the ED with chief complaint of abdominal pain.  Patient recently underwent D&C for miscarriage 5 days ago.  Has been having continued lower abdominal pain that is become diffuse, more severe today.  Continues to have mild to moderate vaginal bleeding.  Fever today up to 100.4.  Here for evaluation.  Denies chest pain or shortness of breath, thinks she may be having some vaginal discharge as well.  Review of Systems  A complete 10 system review of systems was obtained and all systems are negative except as noted in the HPI and PMH.   Patient's Health History    Past Medical History:  Diagnosis Date   Allergy    skin sensitivity - itching   Anxiety    Dermatographia    Endometriosis    Stage 4    Past Surgical History:  Procedure Laterality Date   FRACTURE SURGERY     LEFT ARM   ROBOTIC ASSISTED LAPAROSCOPIC OVARIAN CYSTECTOMY Bilateral 12/05/2018   Procedure: XI ROBOTIC ASSISTED LAPAROSCOPIC BILATERAL OVARIAN CYSTECTOMY, LYSIS OF ADHESIONS;  Surgeon: Everitt Amber, MD;  Location: [facility] ORS;  Service: Gynecology;  Laterality: Bilateral;   WISDOM TOOTH EXTRACTION      Family History  Problem Relation Age of Onset   Breast cancer Paternal Aunt    Other Paternal Grandmother        swelling in lymph nodes   Arthritis Maternal Grandmother    Heart attack Maternal Grandfather    Colon cancer Neg Hx    Pancreatic cancer Neg Hx    Esophageal cancer Neg Hx    Rectal cancer Neg Hx    Stomach cancer Neg Hx     Social History   Socioeconomic History   Marital status: Married    Spouse name: Not on file   Number of children: Not on file   Years of education: Not on file   Highest education level: Not on file   Occupational History   Not on file  Tobacco Use   Smoking status: Never   Smokeless tobacco: Never  Vaping Use   Vaping Use: Never used  Substance and Sexual Activity   Alcohol use: Yes    Comment: occ glass of wine   Drug use: No   Sexual activity: Yes    Birth control/protection: None    Comment: last period 02-13-20.  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[employer]   Social Determinants of Health   Financial Resource Strain: Not on file  Food Insecurity: Not on file  Transportation Needs: Not on file  Physical Activity: Not on file  Stress: Not on file  Social Connections: Not on file  Intimate Partner Violence: Not on file     Physical Exam   Vitals:   11/11/20 0000 11/11/20 0127  BP: 120/73 116/70  Pulse: 80 93  Resp: 16 16  Temp:    SpO2: 100% 100%    CONSTITUTIONAL: Well-appearing, NAD NEURO:  Alert and oriented x 3, no focal deficits EYES:  eyes equal and reactive ENT/NECK:  no LAD, no JVD CARDIO: Regular rate, well-perfused, normal S1 and S2 PULM:  CTAB  no wheezing or rhonchi GI/GU:  normal bowel sounds, non-distended, mild diffuse tenderness MSK/SPINE:  No gross deformities, no edema SKIN:  no rash, atraumatic PSYCH:  Appropriate speech and behavior  *Additional and/or pertinent findings included in MDM below  Diagnostic and Interventional Summary    EKG Interpretation  Date/Time:    Ventricular Rate:    PR Interval:    QRS Duration:   QT Interval:    QTC Calculation:   R Axis:     Text Interpretation:          Labs Reviewed  COMPREHENSIVE METABOLIC PANEL - Abnormal; Notable for the following components:      Result Value   Potassium 3.4 (*)    Glucose, Bld 100 (*)    Calcium 8.8 (*)    Albumin 3.4 (*)    AST 14 (*)    All other components within normal limits  CBC - Abnormal; Notable for the following components:   WBC 12.2 (*)    RBC 3.76 (*)    MCV  101.3 (*)    All other components within normal limits  URINALYSIS, ROUTINE W REFLEX MICROSCOPIC - Abnormal; Notable for the following components:   APPearance HAZY (*)    Hgb urine dipstick LARGE (*)    Ketones, ur 20 (*)    Protein, ur 30 (*)    Leukocytes,Ua MODERATE (*)    RBC / HPF >50 (*)    WBC, UA >50 (*)    Bacteria, UA RARE (*)    All other components within normal limits  DIFFERENTIAL - Abnormal; Notable for the following components:   Neutro Abs 9.3 (*)    All other components within normal limits  HCG, QUANTITATIVE, PREGNANCY - Abnormal; Notable for the following components:   hCG, Beta Chain, Quant, S 970 (*)    All other components within normal limits  LIPASE, BLOOD  ABO/RH    CT ABDOMEN PELVIS W CONTRAST  Final Result      Medications  cefTRIAXone (ROCEPHIN) 2 g in sodium chloride 0.9 % 100 mL IVPB (2 g Intravenous New Bag/Given 11/11/20 0126)  sodium chloride 0.9 % bolus 1,000 mL (0 mLs Intravenous Stopped 11/11/20 0032)  HYDROmorphone (DILAUDID) injection 0.5 mg (0.5 mg Intravenous Given 11/10/20 2323)  iohexol (OMNIPAQUE) 300 MG/ML solution 75 mL (75 mLs Intravenous Contrast Given 11/10/20 2331)  HYDROmorphone (DILAUDID) injection 0.5 mg (0.5 mg Intravenous Given 11/11/20 0126)  ketorolac (TORADOL) 15 MG/ML injection 15 mg (15 mg Intravenous Given 11/11/20 0126)     Procedures  /  Critical Care Procedures  ED Course and Medical Decision Making  I have reviewed the triage vital signs, the nursing notes, and pertinent available records from the EMR.  Listed above are laboratory and imaging tests that I personally ordered, reviewed, and interpreted and then considered in my medical decision making (see below for details).  Initial concern for possible endometritis given the recent D&C with fever and worsening abdominal pain.  Will obtain CT to evaluate.     CT is revealing multiple cystlike structures, likely related to endometriosis.  Has a prior CT with  similar structures.  There is some signs of mild inflammation.  Patient's presentation and CT findings discussed with Dr. Elonda Husky of GYN, recommending dose of ceftriaxone here IV, discharged on doxycycline and close follow-up with OB/GYN.  Patient is feeling better and is agreeable with this plan.  Barth Kirks. Sedonia Small, [place] [email or web address]  Final Clinical Impressions(s) /  ED Diagnoses     ICD-10-CM   1. Endometritis  N71.9       ED Discharge Orders          Ordered    doxycycline (VIBRAMYCIN) 100 MG capsule  2 times daily        11/11/20 0138    oxyCODONE (ROXICODONE) 5 MG immediate release tablet  Every 4 hours PRN        11/11/20 0138             Discharge Instructions Discussed with and Provided to Patient:     Discharge Instructions      You were evaluated in the Emergency Department and after careful evaluation, we did not find any emergent condition requiring admission or further testing in the hospital.  Symptoms seem to be due to endometritis.  Please take the doxycycline antibiotic as directed and follow-up closely with your OB/GYN doctor.  Recommend Tylenol 1000 mg every 4-6 hours and/or Motrin 600 mg every 4-6 hours for pain.  You can use the oxycodone medication for more significant pain.  Please return to the Emergency Department if you experience any worsening of your condition.  Thank you for allowing [name] to be a part of your care.         Maudie Flakes, MD 11/11/20 [phone]

## 2020-11-10 NOTE — ED Triage Notes (Signed)
Pt states she had a D&C on Wednesday and started having abdominal pain Saturday evening and that she started to run a fever ( 100.2) a home today.

## 2020-11-10 NOTE — ED Notes (Signed)
Patient transported to CT 

## 2020-11-11 MED ORDER — KETOROLAC TROMETHAMINE 15 MG/ML IJ SOLN
15.0000 mg | Freq: Once | INTRAMUSCULAR | Status: AC
  Administered 2020-11-11: 15 mg via INTRAVENOUS
  Filled 2020-11-11: qty 1

## 2020-11-11 MED ORDER — SODIUM CHLORIDE 0.9 % IV SOLN
2.0000 g | Freq: Once | INTRAVENOUS | Status: AC
  Administered 2020-11-11: 2 g via INTRAVENOUS
  Filled 2020-11-11: qty 20

## 2020-11-11 MED ORDER — DOXYCYCLINE HYCLATE 100 MG PO CAPS: 100.0000 mg | ORAL_CAPSULE | Freq: Two times a day (BID) | ORAL | 0 refills | Status: AC

## 2020-11-11 MED ORDER — OXYCODONE HCL 5 MG PO TABS: 5.0000 mg | ORAL_TABLET | ORAL | 0 refills | Status: DC | PRN

## 2020-11-11 MED ORDER — HYDROMORPHONE HCL 1 MG/ML IJ SOLN
0.5000 mg | Freq: Once | INTRAMUSCULAR | Status: AC
Start: 2020-11-11 — End: 2020-11-11
  Administered 2020-11-11: 0.5 mg via INTRAVENOUS
  Filled 2020-11-11: qty 1

## 2020-11-11 NOTE — Discharge Instructions (Addendum)
You were evaluated in the Emergency Department and after careful evaluation, we did not find any emergent condition requiring admission or further testing in the hospital.  Symptoms seem to be due to endometritis.  Please take the doxycycline antibiotic as directed and follow-up closely with your OB/GYN doctor.  Recommend Tylenol 1000 mg every 4-6 hours and/or Motrin 600 mg every 4-6 hours for pain.  You can use the oxycodone medication for more significant pain.  Please return to the Emergency Department if you experience any worsening of your condition.  Thank you for allowing [name] to be a part of your care.

## 2021-01-03 ENCOUNTER — Other Ambulatory Visit: Payer: Self-pay | Admitting: Family

## 2021-01-03 DIAGNOSIS — F411 Generalized anxiety disorder: Secondary | ICD-10-CM

## 2021-01-03 DIAGNOSIS — F321 Major depressive disorder, single episode, moderate: Secondary | ICD-10-CM

## 2021-01-15 ENCOUNTER — Ambulatory Visit (INDEPENDENT_AMBULATORY_CARE_PROVIDER_SITE_OTHER): Payer: Medicaid Other | Admitting: Family

## 2021-01-15 ENCOUNTER — Encounter: Payer: Self-pay | Admitting: Family

## 2021-01-15 DIAGNOSIS — M545 Low back pain, unspecified: Secondary | ICD-10-CM | POA: Diagnosis not present

## 2021-01-15 DIAGNOSIS — F411 Generalized anxiety disorder: Secondary | ICD-10-CM | POA: Diagnosis not present

## 2021-01-15 DIAGNOSIS — F321 Major depressive disorder, single episode, moderate: Secondary | ICD-10-CM | POA: Diagnosis not present

## 2021-01-15 DIAGNOSIS — G8929 Other chronic pain: Secondary | ICD-10-CM

## 2021-01-15 MED ORDER — BUSPIRONE HCL 10 MG PO TABS: 10.0000 mg | ORAL_TABLET | Freq: Three times a day (TID) | ORAL | 2 refills | Status: DC

## 2021-01-15 MED ORDER — ESCITALOPRAM OXALATE 20 MG PO TABS: 20.0000 mg | ORAL_TABLET | Freq: Every day | ORAL | 3 refills | Status: DC

## 2021-01-15 NOTE — Patient Instructions (Signed)
Health Maintenance, Female Adopting a healthy lifestyle and getting preventive care are important in promoting health and wellness. Ask your health care provider about: The right schedule for you to have regular tests and exams. Things you can do on your own to prevent diseases and keep yourself healthy. What should I know about diet, weight, and exercise? Eat a healthy diet  Eat a diet that includes plenty of vegetables, fruits, low-fat dairy products, and lean protein. Do not eat a lot of foods that are high in solid fats, added sugars, or sodium.  Maintain a healthy weight Body mass index (BMI) is used to identify weight problems. It estimates body fat based on height and weight. Your health care provider can help determineyour BMI and help you achieve or maintain a healthy weight. Get regular exercise Get regular exercise. This is one of the most important things you can do for your health. Most adults should: Exercise for at least 150 minutes each week. The exercise should increase your heart rate and make you sweat (moderate-intensity exercise). Do strengthening exercises at least twice a week. This is in addition to the moderate-intensity exercise. Spend less time sitting. Even light physical activity can be beneficial. Watch cholesterol and blood lipids Have your blood tested for lipids and cholesterol at 31 years of age, then havethis test every 5 years. Have your cholesterol levels checked more often if: Your lipid or cholesterol levels are high. You are older than 31 years of age. You are at high risk for heart disease. What should I know about cancer screening? Depending on your health history and family history, you may need to have cancer screening at various ages. This may include screening for: Breast cancer. Cervical cancer. Colorectal cancer. Skin cancer. Lung cancer. What should I know about heart disease, diabetes, and high blood pressure? Blood pressure and heart  disease High blood pressure causes heart disease and increases the risk of stroke. This is more likely to develop in people who have high blood pressure readings, are of African descent, or are overweight. Have your blood pressure checked: Every 3-5 years if you are 18-39 years of age. Every year if you are 40 years old or older. Diabetes Have regular diabetes screenings. This checks your fasting blood sugar level. Have the screening done: Once every three years after age 40 if you are at a normal weight and have a low risk for diabetes. More often and at a younger age if you are overweight or have a high risk for diabetes. What should I know about preventing infection? Hepatitis B If you have a higher risk for hepatitis B, you should be screened for this virus. Talk with your health care provider to find out if you are at risk forhepatitis B infection. Hepatitis C Testing is recommended for: Everyone born from 1945 through 1965. Anyone with known risk factors for hepatitis C. Sexually transmitted infections (STIs) Get screened for STIs, including gonorrhea and chlamydia, if: You are sexually active and are younger than 31 years of age. You are older than 31 years of age and your health care provider tells you that you are at risk for this type of infection. Your sexual activity has changed since you were last screened, and you are at increased risk for chlamydia or gonorrhea. Ask your health care provider if you are at risk. Ask your health care provider about whether you are at high risk for HIV. Your health care provider may recommend a prescription medicine to help   prevent HIV infection. If you choose to take medicine to prevent HIV, you should first get tested for HIV. You should then be tested every 3 months for as long as you are taking the medicine. Pregnancy If you are about to stop having your period (premenopausal) and you may become pregnant, seek counseling before you get  pregnant. Take 400 to 800 micrograms (mcg) of folic acid every day if you become pregnant. Ask for birth control (contraception) if you want to prevent pregnancy. Osteoporosis and menopause Osteoporosis is a disease in which the bones lose minerals and strength with aging. This can result in bone fractures. If you are 65 years old or older, or if you are at risk for osteoporosis and fractures, ask your health care provider if you should: Be screened for bone loss. Take a calcium or vitamin D supplement to lower your risk of fractures. Be given hormone replacement therapy (HRT) to treat symptoms of menopause. Follow these instructions at home: Lifestyle Do not use any products that contain nicotine or tobacco, such as cigarettes, e-cigarettes, and chewing tobacco. If you need help quitting, ask your health care provider. Do not use street drugs. Do not share needles. Ask your health care provider for help if you need support or information about quitting drugs. Alcohol use Do not drink alcohol if: Your health care provider tells you not to drink. You are pregnant, may be pregnant, or are planning to become pregnant. If you drink alcohol: Limit how much you use to 0-1 drink a day. Limit intake if you are breastfeeding. Be aware of how much alcohol is in your drink. In the [place] one drink equals one 12 oz bottle of beer (355 mL), one 5 oz glass of wine (148 mL), or one 1 oz glass of hard liquor (44 mL). General instructions Schedule regular health, dental, and eye exams. Stay current with your vaccines. Tell your health care provider if: You often feel depressed. You have ever been abused or do not feel safe at home. Summary Adopting a healthy lifestyle and getting preventive care are important in promoting health and wellness. Follow your health care provider's instructions about healthy diet, exercising, and getting tested or screened for diseases. Follow your health care provider's  instructions on monitoring your cholesterol and blood pressure. This information is not intended to replace advice given to you by your health care provider. Make sure you discuss any questions you have with your healthcare provider. Document Revised: 05/03/2018 Document Reviewed: 05/03/2018 Elsevier Patient Education  2022 Elsevier Inc.  

## 2021-01-15 NOTE — Progress Notes (Signed)
Virtual Visit  Note Due to COVID-19 pandemic this visit was conducted virtually. This visit type was conducted due to national recommendations for restrictions regarding the COVID-19 Pandemic (e.g. social distancing, sheltering in place) in an effort to limit this patient's exposure and mitigate transmission in our community. All issues noted in this document were discussed and addressed.  A physical exam was not performed with this format.  I connected with Natalie Price on 01/15/21 at 8:15 AM  by telephone and verified that I am speaking with the correct person using two identifiers. Natalie Price is currently located at home and no one is currently with her during visit. The provider, Evelina Dun, FNP is located in their office at time of visit.  I discussed the limitations, risks, security and privacy concerns of performing an evaluation and management service by telephone and the availability of in person appointments. I also discussed with the patient that there may be a patient responsible charge related to this service. The patient expressed understanding and agreed to proceed.  Natalie Price are scheduled for a virtual visit with your provider today.    Just as we do with appointments in the office, we must obtain your consent to participate.  Your consent will be active for this visit and any virtual visit you may have with one of our providers in the next 365 days.    If you have a MyChart account, I can also send a copy of this consent to you electronically.  All virtual visits are billed to your insurance company just like a traditional visit in the office.  As this is a virtual visit, video technology does not allow for your provider to perform a traditional examination.  This may limit your provider's ability to fully assess your condition.  If your provider identifies any concerns that need to be evaluated in person or the need to arrange testing such as labs, EKG, etc, we  will make arrangements to do so.    Although advances in technology are sophisticated, we cannot ensure that it will always work on either your end or our end.  If the connection with a video visit is poor, we may have to switch to a telephone visit.  With either a video or telephone visit, we are not always able to ensure that we have a secure connection.   I need to obtain your verbal consent now.   Are you willing to proceed with your visit today?   Natalie Price has provided verbal consent on 01/15/2021 for a virtual visit (video or telephone).   Evelina Dun, [place] 01/15/2021  8:18 AM    History and Present Illness:  Pt presents to the office today for chronic follow up.    She is currently trying to become pregnant and taking fertility medications. She reports she has been trying for two years.  Anxiety Presents for follow-up visit. Symptoms include depressed mood, excessive worry, irritability, nervous/anxious behavior and restlessness. Symptoms occur occasionally. The severity of symptoms is moderate. The quality of sleep is good.    Depression        This is a chronic problem.  The current episode started more than 1 year ago.   The onset quality is gradual.   The problem occurs intermittently.  Associated symptoms include irritable, restlessness and sad.  Associated symptoms include no helplessness and no hopelessness.  Past treatments include SSRIs - Selective serotonin reuptake inhibitors.  Compliance with treatment is good.  Past medical history  includes anxiety.   Back Pain This is a chronic problem. The current episode started more than 1 year ago. The problem occurs intermittently. The problem has been waxing and waning since onset. The pain is present in the lumbar spine. The quality of the pain is described as aching. The pain is at a severity of 9/10. The pain is moderate. She has tried muscle relaxant and NSAIDs for the symptoms. The treatment provided mild relief.      Review of Systems  Constitutional:  Positive for irritability.  Musculoskeletal:  Positive for back pain.  Psychiatric/Behavioral:  Positive for depression. The patient is nervous/anxious.   All other systems reviewed and are negative.   Observations/Objective: No SOB or distress noted   Assessment and Plan: 1. GAD (generalized anxiety disorder) - escitalopram (LEXAPRO) 20 MG tablet; Take 1 tablet (20 mg total) by mouth daily.  Dispense: 90 tablet; Refill: 3 - busPIRone (BUSPAR) 10 MG tablet; Take 1 tablet (10 mg total) by mouth 3 (three) times daily.  Dispense: 180 tablet; Refill: 2  2. Depression, major, single episode, moderate (HCC) - escitalopram (LEXAPRO) 20 MG tablet; Take 1 tablet (20 mg total) by mouth daily.  Dispense: 90 tablet; Refill: 3 - busPIRone (BUSPAR) 10 MG tablet; Take 1 tablet (10 mg total) by mouth 3 (three) times daily.  Dispense: 180 tablet; Refill: 2  3. Chronic bilateral low back pain, unspecified whether sciatica present  Continue Lexapro and Buspar Keep GYN appointment     I discussed the assessment and treatment plan with the patient. The patient was provided an opportunity to ask questions and all were answered. The patient agreed with the plan and demonstrated an understanding of the instructions.   The patient was advised to call back or seek an in-person evaluation if the symptoms worsen or if the condition fails to improve as anticipated.  The above assessment and management plan was discussed with the patient. The patient verbalized understanding of and has agreed to the management plan. Patient is aware to call the clinic if symptoms persist or worsen. Patient is aware when to return to the clinic for a follow-up visit. Patient educated on when it is appropriate to go to the emergency department.   Time call ended:  8:27 AM   I provided 12 minutes of  non face-to-face time during this encounter.    Evelina Dun, FNP

## 2021-02-01 IMAGING — US US PELVIS COMPLETE WITH TRANSVAGINAL
1 series · 13 of 25 positions shown · non-contrast
Comparison: Prior ultrasound from 11/01/2018.

CLINICAL DATA: Initial evaluation for stage IV endometriosis, new
onset pelvic pain, cystic fullness on physical exam.



[Series 1: us pelvis complete with transvaginal · 13 of 101 slices shown]
[im 1/101]
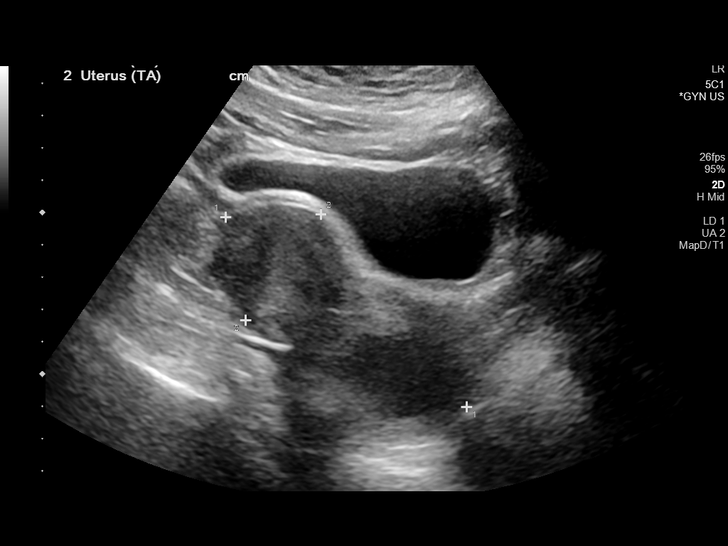
[im 9/101]
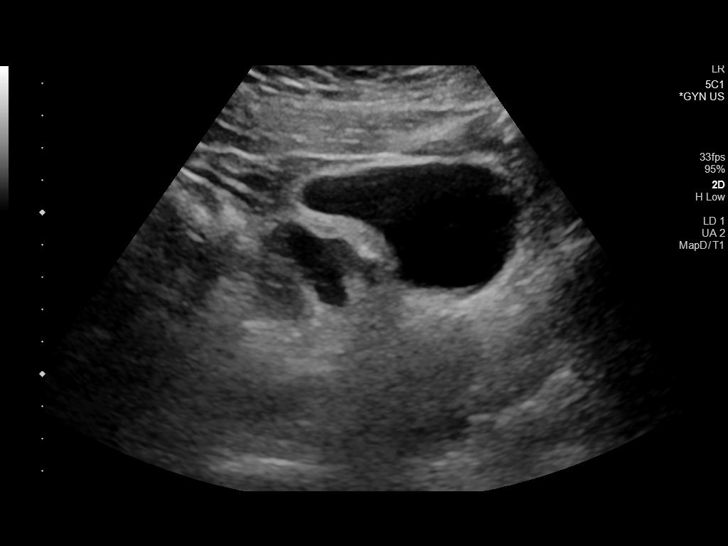
[im 17/101]
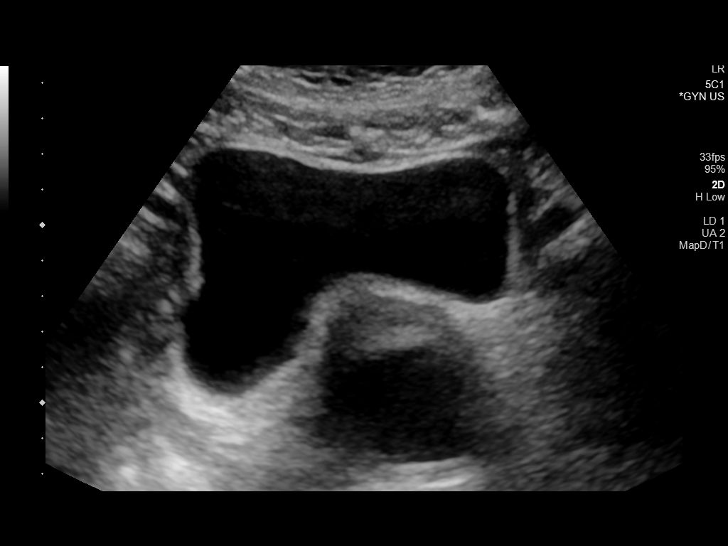
[im 26/101]
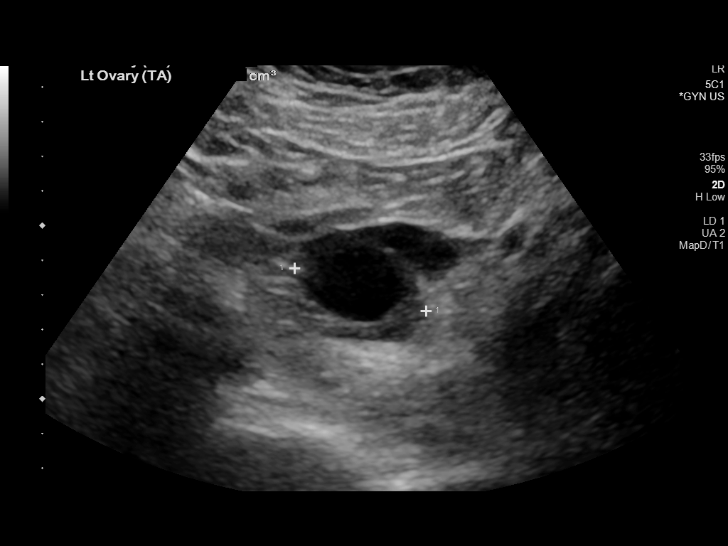
[im 34/101]
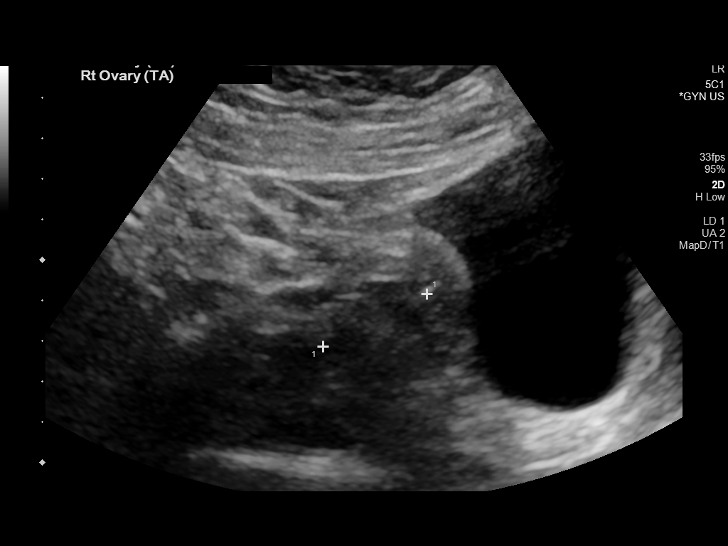
[im 42/101]
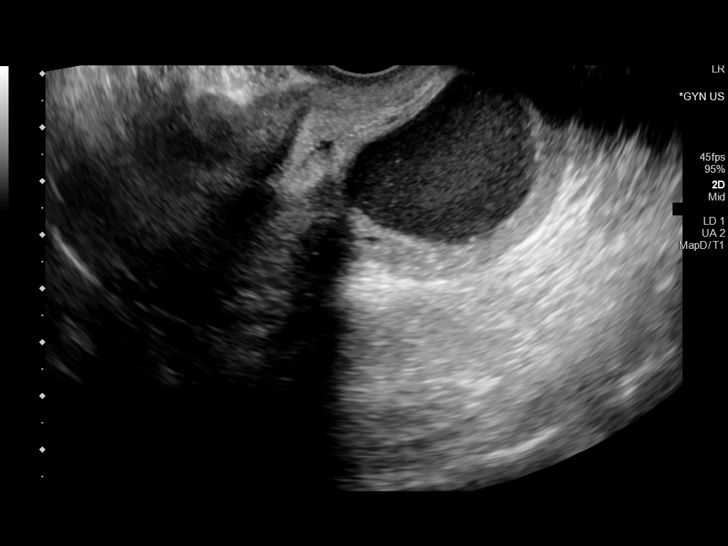
[im 51/101]
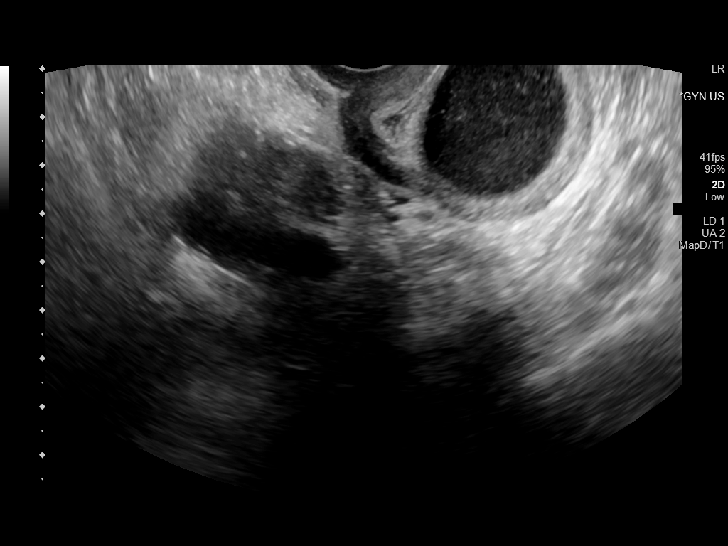
[im 59/101]
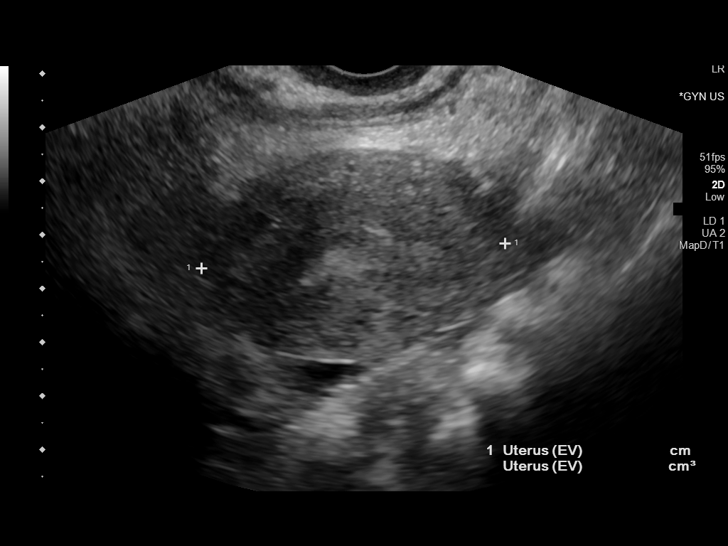
[im 67/101]
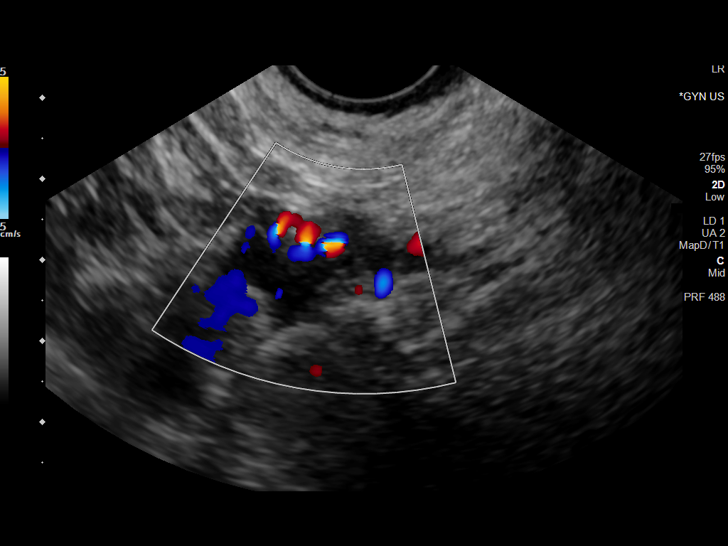
[im 76/101]
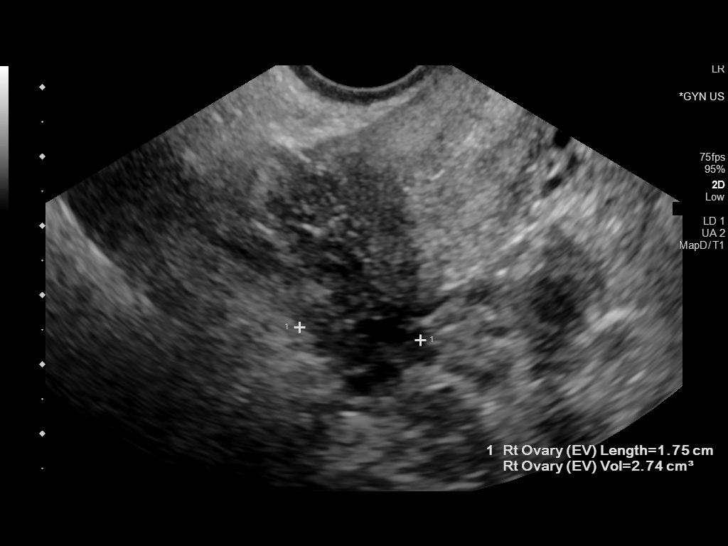
[im 84/101]
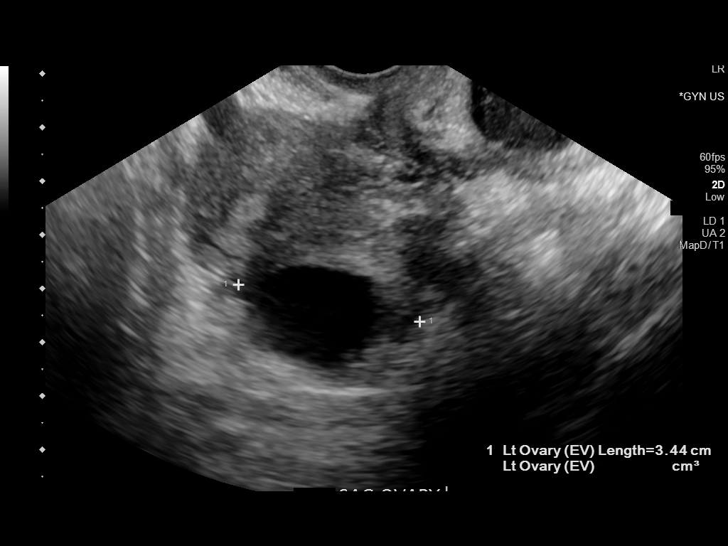
[im 92/101]
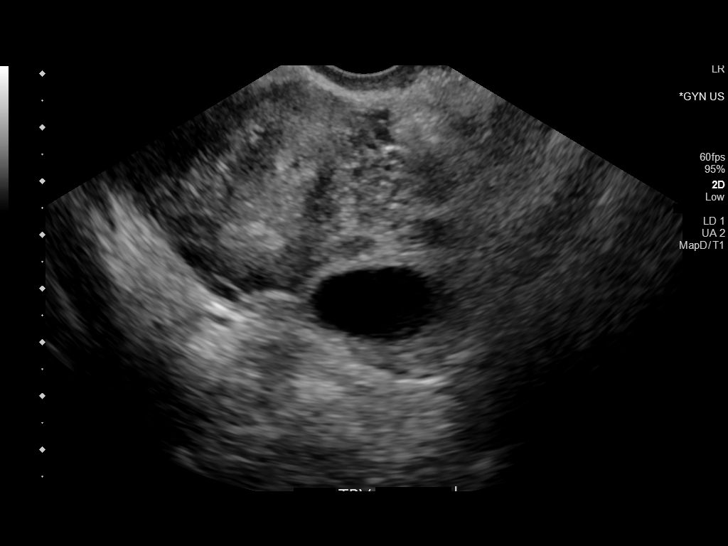
[im 101/101]
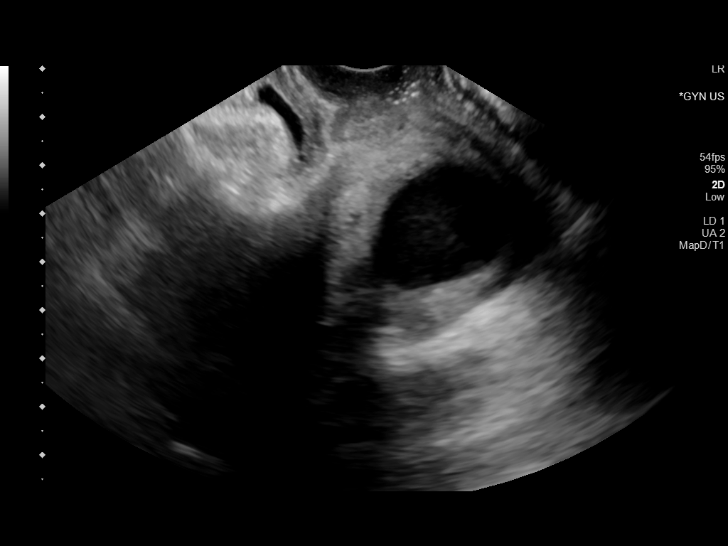

[13 of 25 positions shown; findings below may reference images not displayed]

FINDINGS: Uterus

Measurements: 9.6 x 3.9 x 5.7 cm = volume: 109 mL. Uterus is
anteverted. There is a well-circumscribed ovoid hypoechoic cystic
lesion with low-level internal echoes positioned along the posterior
wall of the cervix, measuring 3.7 x 3.1 x 4.4 cm. No internal
vascularity or solid component. Finding most characteristic of an
endometrioma. There are a few additional ill-defined cystic area is
clustered about the margin of the uterine body, best appreciated
transabdominal image 14/102. Findings are nonspecific, but could
reflect additional small endometriomas. Focal loculated fluid
collections could also have this appearance. No significant
inflammatory changes to suggest TOA/PID.

Endometrium

Thickness: 7 mm.  No focal abnormality visualized.

Right ovary

Measurements: 2.6 x 1.5 x 2.1 cm = volume: 4 mL. 1.1 cm simple cyst
noted, most consistent with a normal physiologic follicular
cyst/dominant follicle. No other adnexal mass.

Left ovary

Measurements: 3.5 x 2.1 x 3.5 cm = volume: 13 mL. 2.5 x 1.8 x 2.9 cm
simple cyst, most consistent with a normal physiologic follicular
cyst/dominant follicle.

Other findings

Trace free fluid noted within the pelvis.
IMPRESSION: 1. 4.4 cm complex cystic lesion positioned at the posterior wall of
the cervix, most characteristic of an endometrioma.
2. Additional ill-defined cystic areas positioned along the
periphery of the uterine body/myometrium, indeterminate, but could
reflect additional small endometriomas. Small loculated fluid
collections could also have this appearance. No significant
inflammatory changes to suggest TOA/PID. Correlation with dedicated
pelvic MRI, with and without contrast, suggested for further
characterization.
3. Simple bilateral ovarian cysts as above, most characteristic for
benign physiologic follicular cysts/dominant follicles.

## 2021-03-26 ENCOUNTER — Other Ambulatory Visit: Payer: Self-pay | Admitting: Family

## 2021-03-26 MED ORDER — IBUPROFEN 800 MG PO TABS: 800.0000 mg | ORAL_TABLET | Freq: Three times a day (TID) | ORAL | 99 refills | Status: DC | PRN

## 2021-05-11 ENCOUNTER — Telehealth (INDEPENDENT_AMBULATORY_CARE_PROVIDER_SITE_OTHER): Payer: Medicaid Other | Admitting: Family

## 2021-05-11 ENCOUNTER — Encounter: Payer: Self-pay | Admitting: Family

## 2021-05-11 DIAGNOSIS — F411 Generalized anxiety disorder: Secondary | ICD-10-CM | POA: Diagnosis not present

## 2021-05-11 DIAGNOSIS — R6882 Decreased libido: Secondary | ICD-10-CM | POA: Diagnosis not present

## 2021-05-11 DIAGNOSIS — F321 Major depressive disorder, single episode, moderate: Secondary | ICD-10-CM

## 2021-05-11 MED ORDER — FLUOXETINE HCL 40 MG PO CAPS: 40.0000 mg | ORAL_CAPSULE | Freq: Every day | ORAL | 3 refills | Status: DC

## 2021-05-11 NOTE — Progress Notes (Signed)
Virtual Visit  Note Due to COVID-19 pandemic this visit was conducted virtually. This visit type was conducted due to national recommendations for restrictions regarding the COVID-19 Pandemic (e.g. social distancing, sheltering in place) in an effort to limit this patient's exposure and mitigate transmission in our community. All issues noted in this document were discussed and addressed.  A physical exam was not performed with this format.  I connected with Natalie Price on 05/11/21 at 4:00 pm by telephone and verified that I am speaking with the correct person using two identifiers. Natalie Price is currently located at car and no one is currently with her during visit. The provider, Evelina Dun, FNP is located in their office at time of visit.  I discussed the limitations, risks, security and privacy concerns of performing an evaluation and management service by telephone and the availability of in person appointments. I also discussed with the patient that there may be a patient responsible charge related to this service. The patient expressed understanding and agreed to proceed.  Natalie Price, Natalie Price are scheduled for a virtual visit with your provider today.    Just as we do with appointments in the office, we must obtain your consent to participate.  Your consent will be active for this visit and any virtual visit you may have with one of our providers in the next 365 days.    If you have a MyChart account, I can also send a copy of this consent to you electronically.  All virtual visits are billed to your insurance company just like a traditional visit in the office.  As this is a virtual visit, video technology does not allow for your provider to perform a traditional examination.  This may limit your provider's ability to fully assess your condition.  If your provider identifies any concerns that need to be evaluated in person or the need to arrange testing such as labs, EKG, etc, we will  make arrangements to do so.    Although advances in technology are sophisticated, we cannot ensure that it will always work on either your end or our end.  If the connection with a video visit is poor, we may have to switch to a telephone visit.  With either a video or telephone visit, we are not always able to ensure that we have a secure connection.   I need to obtain your verbal consent now.   Are you willing to proceed with your visit today?   Natalie Price has provided verbal consent on 05/11/2021 for a virtual visit (video or telephone).   Evelina Dun, [place] 05/11/2021  4:11 PM    History and Present Illness:  Pt presents the office today to discuss lexapro. She reports her anxiety and depression seem to be well controlled. Since starting this medication she has had decrease libdo and unable to have an organism.  Depression        This is a chronic problem.  The current episode started more than 1 year ago.   The onset quality is gradual.   The problem occurs intermittently.  Associated symptoms include no helplessness, no hopelessness, not irritable, no restlessness and not sad.  Past treatments include SSRIs - Selective serotonin reuptake inhibitors.  Compliance with treatment is good.  Past medical history includes anxiety.   Anxiety Presents for follow-up visit. Symptoms include depressed mood. Patient reports no excessive worry, irritability, nervous/anxious behavior or restlessness. The severity of symptoms is moderate.       Review  of Systems  Constitutional:  Negative for irritability.  Psychiatric/Behavioral:  Positive for depression. The patient is not nervous/anxious.     Observations/Objective: No SOB or distress noted   Assessment and Plan: 1. GAD (generalized anxiety disorder)  - FLUoxetine (PROZAC) 40 MG capsule; Take 1 capsule (40 mg total) by mouth daily.  Dispense: 90 capsule; Refill: 3  2. Depression, major, single episode, moderate (HCC)  -  FLUoxetine (PROZAC) 40 MG capsule; Take 1 capsule (40 mg total) by mouth daily.  Dispense: 90 capsule; Refill: 3  3. Decreased libido  Stop Lexapro and start Prozac 40 mg  If adverse effects continue will try Pristiq  Stress management    I discussed the assessment and treatment plan with the patient. The patient was provided an opportunity to ask questions and all were answered. The patient agreed with the plan and demonstrated an understanding of the instructions.   The patient was advised to call back or seek an in-person evaluation if the symptoms worsen or if the condition fails to improve as anticipated.  The above assessment and management plan was discussed with the patient. The patient verbalized understanding of and has agreed to the management plan. Patient is aware to call the clinic if symptoms persist or worsen. Patient is aware when to return to the clinic for a follow-up visit. Patient educated on when it is appropriate to go to the emergency department.   Time call ended:  4:11 pm   I provided 11 minutes of  non face-to-face time during this encounter.    Evelina Dun, FNP

## 2021-06-28 ENCOUNTER — Emergency Department (HOSPITAL_COMMUNITY): Payer: Medicaid Other

## 2021-06-28 ENCOUNTER — Encounter (HOSPITAL_COMMUNITY): Payer: Self-pay | Admitting: Emergency Medicine

## 2021-06-28 ENCOUNTER — Emergency Department (HOSPITAL_COMMUNITY)
Admission: EM | Admit: 2021-06-28 | Discharge: 2021-06-28 | Disposition: A | Discharge status: Home / Self Care | Payer: Medicaid Other | Attending: Emergency Medicine | Admitting: Emergency Medicine

## 2021-06-28 DIAGNOSIS — N809 Endometriosis, unspecified: Secondary | ICD-10-CM | POA: Insufficient documentation

## 2021-06-28 DIAGNOSIS — R1084 Generalized abdominal pain: Secondary | ICD-10-CM | POA: Diagnosis present

## 2021-06-28 LAB — URINALYSIS, ROUTINE W REFLEX MICROSCOPIC
Glucose, UA: NEGATIVE mg/dL
Ketones, ur: >80 mg/dL — AB
Nitrite: NEGATIVE
Protein, ur: 100 mg/dL — AB
Specific Gravity, Urine: >1.03 — ABNORMAL HIGH (ref 1.005–1.030)
pH: 6 (ref 5.0–8.0)

## 2021-06-28 LAB — COMPREHENSIVE METABOLIC PANEL
ALT: 12 U/L (ref 0–44)
AST: 12 U/L — ABNORMAL LOW (ref 15–41)
Albumin: 4.3 g/dL (ref 3.5–5.0)
Alkaline Phosphatase: 89 U/L (ref 38–126)
Anion gap: 14 (ref 5–15)
BUN: 10 mg/dL (ref 6–20)
CO2: 19 mmol/L — ABNORMAL LOW (ref 22–32)
Calcium: 9.5 mg/dL (ref 8.9–10.3)
Chloride: 99 mmol/L (ref 98–111)
Creatinine, Ser: 0.76 mg/dL (ref 0.44–1.00)
GFR, Estimated: >60 mL/min (ref >60)
Glucose, Bld: 98 mg/dL (ref 70–99)
Potassium: 3.1 mmol/L — ABNORMAL LOW (ref 3.5–5.1)
Sodium: 132 mmol/L — ABNORMAL LOW (ref 135–145)
Total Bilirubin: 1 mg/dL (ref 0.3–1.2)
Total Protein: 8.9 g/dL — ABNORMAL HIGH (ref 6.5–8.1)

## 2021-06-28 LAB — CBC
HCT: 46.8 % — ABNORMAL HIGH (ref 36.0–46.0)
Hemoglobin: 15.3 g/dL — ABNORMAL HIGH (ref 12.0–15.0)
MCH: 33.3 pg (ref 26.0–34.0)
MCHC: 32.7 g/dL (ref 30.0–36.0)
MCV: 101.7 fL — ABNORMAL HIGH (ref 80.0–100.0)
Platelets: 307 10*3/uL (ref 150–400)
RBC: 4.6 MIL/uL (ref 3.87–5.11)
RDW: 12.6 % (ref 11.5–15.5)
WBC: 12.5 10*3/uL — ABNORMAL HIGH (ref 4.0–10.5)
nRBC: 0 % (ref 0.0–0.2)

## 2021-06-28 LAB — PREGNANCY, URINE: Preg Test, Ur: NEGATIVE

## 2021-06-28 LAB — URINALYSIS, MICROSCOPIC (REFLEX)

## 2021-06-28 LAB — POC URINE PREG, ED: Preg Test, Ur: NEGATIVE

## 2021-06-28 LAB — LIPASE, BLOOD: Lipase: 23 U/L (ref 11–51)

## 2021-06-28 MED ORDER — HYDROMORPHONE HCL 1 MG/ML IJ SOLN
0.5000 mg | Freq: Once | INTRAMUSCULAR | Status: AC
  Administered 2021-06-28: 0.5 mg via INTRAVENOUS
  Filled 2021-06-28: qty 1

## 2021-06-28 MED ORDER — IOHEXOL 300 MG/ML  SOLN
100.0000 mL | Freq: Once | INTRAMUSCULAR | Status: AC | PRN
  Administered 2021-06-28: 100 mL via INTRAVENOUS

## 2021-06-28 MED ORDER — KETOROLAC TROMETHAMINE 15 MG/ML IJ SOLN
15.0000 mg | Freq: Once | INTRAMUSCULAR | Status: AC
  Administered 2021-06-28: 15 mg via INTRAVENOUS
  Filled 2021-06-28: qty 1

## 2021-06-28 MED ORDER — OXYCODONE HCL 5 MG PO TABS: 5.0000 mg | ORAL_TABLET | ORAL | 0 refills | Status: DC | PRN

## 2021-06-28 MED ORDER — ONDANSETRON HCL 4 MG/2ML IJ SOLN
4.0000 mg | Freq: Once | INTRAMUSCULAR | Status: AC
  Administered 2021-06-28: 4 mg via INTRAVENOUS
  Filled 2021-06-28: qty 2

## 2021-06-28 MED ORDER — SODIUM CHLORIDE 0.9 % IV BOLUS
1000.0000 mL | Freq: Once | INTRAVENOUS | Status: AC
  Administered 2021-06-28: 1000 mL via INTRAVENOUS

## 2021-06-28 MED ORDER — HYDROMORPHONE HCL 1 MG/ML IJ SOLN
1.0000 mg | Freq: Once | INTRAMUSCULAR | Status: AC
  Administered 2021-06-28: 1 mg via INTRAVENOUS
  Filled 2021-06-28: qty 1

## 2021-06-28 MED ORDER — ONDANSETRON 4 MG PO TBDP: 4.0000 mg | ORAL_TABLET | Freq: Three times a day (TID) | ORAL | 0 refills | Status: DC | PRN

## 2021-06-28 NOTE — ED Provider Notes (Signed)
[facility] Emergency Department Provider Note MRN:  [id number]  Arrival date & time: 06/28/21     Chief Complaint   Abdominal Pain   History of Present Illness   Natalie Price is a 31 y.o. year-old female with a history of endometriosis presenting to the ED with chief complaint of abdominal pain.  Worsening abdominal pain over the past 3 days.  Had menstrual cycle 2 weeks ago.  Some mild vaginal spotting recently.  Nausea vomiting and diarrhea has been constant as well.  Review of Systems  A thorough review of systems was obtained and all systems are negative except as noted in the HPI and PMH.   Patient's Health History    Past Medical History:  Diagnosis Date   Allergy    skin sensitivity - itching   Anxiety    Dermatographia    Endometriosis    Stage 4    Past Surgical History:  Procedure Laterality Date   FRACTURE SURGERY     LEFT ARM   ROBOTIC ASSISTED LAPAROSCOPIC OVARIAN CYSTECTOMY Bilateral 12/05/2018   Procedure: XI ROBOTIC ASSISTED LAPAROSCOPIC BILATERAL OVARIAN CYSTECTOMY, LYSIS OF ADHESIONS;  Surgeon: Everitt Amber, MD;  Location: [facility];  Service: Gynecology;  Laterality: Bilateral;   WISDOM TOOTH EXTRACTION      Family History  Problem Relation Age of Onset   Breast cancer Paternal Aunt    Other Paternal Grandmother        swelling in lymph nodes   Arthritis Maternal Grandmother    Heart attack Maternal Grandfather    Colon cancer Neg Hx    Pancreatic cancer Neg Hx    Esophageal cancer Neg Hx    Rectal cancer Neg Hx    Stomach cancer Neg Hx     Social History   Socioeconomic History   Marital status: Married    Spouse name: Not on file   Number of children: Not on file   Years of education: Not on file   Highest education level: Not on file  Occupational History   Not on file  Tobacco Use   Smoking status: Never   Smokeless tobacco: Never  Vaping Use   Vaping Use: Never used  Substance and Sexual Activity    Alcohol use: Yes    Comment: occ glass of wine   Drug use: No   Sexual activity: Yes    Birth control/protection: None    Comment: last period 02-13-20.  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[employer]   Social Determinants of Health   Financial Resource Strain: Not on file  Food Insecurity: Not on file  Transportation Needs: Not on file  Physical Activity: Not on file  Stress: Not on file  Social Connections: Not on file  Intimate Partner Violence: Not on file     Physical Exam   Vitals:   06/28/21 0630 06/28/21 0734  BP: 126/82 117/72  Pulse: 93 (!) 101  Resp: 16 16  Temp:  98.3 F (36.8 C)  SpO2: 95% 97%    CONSTITUTIONAL: Well-appearing, NAD NEURO/PSYCH:  Alert and oriented x 3, no focal deficits EYES:  eyes equal and reactive ENT/NECK:  no LAD, no JVD CARDIO: Regular rate, well-perfused, normal S1 and S2 PULM:  CTAB no wheezing or rhonchi GI/GU: Moderate diffuse tenderness MSK/SPINE:  No gross deformities, no edema SKIN:  no rash, atraumatic   *Additional and/or pertinent findings included in  MDM below  Diagnostic and Interventional Summary    EKG Interpretation  Date/Time:    Ventricular Rate:    PR Interval:    QRS Duration:   QT Interval:    QTC Calculation:   R Axis:     Text Interpretation:         Labs Reviewed  CBC - Abnormal; Notable for the following components:      Result Value   WBC 12.5 (*)    Hemoglobin 15.3 (*)    HCT 46.8 (*)    MCV 101.7 (*)    All other components within normal limits  COMPREHENSIVE METABOLIC PANEL - Abnormal; Notable for the following components:   Sodium 132 (*)    Potassium 3.1 (*)    CO2 19 (*)    Total Protein 8.9 (*)    AST 12 (*)    All other components within normal limits  URINALYSIS, ROUTINE W REFLEX MICROSCOPIC - Abnormal; Notable for the following components:   Color, Urine AMBER (*)    APPearance CLOUDY (*)     Specific Gravity, Urine >1.030 (*)    Hgb urine dipstick MODERATE (*)    Bilirubin Urine MODERATE (*)    Ketones, ur >80 (*)    Protein, ur 100 (*)    Leukocytes,Ua TRACE (*)    All other components within normal limits  URINALYSIS, MICROSCOPIC (REFLEX) - Abnormal; Notable for the following components:   Bacteria, UA MANY (*)    All other components within normal limits  LIPASE, BLOOD  PREGNANCY, URINE  POC URINE PREG, ED  I-STAT BETA HCG BLOOD, ED ([facility], [facility], [facility] ONLY)    CT ABDOMEN PELVIS W CONTRAST  Final Result      Medications  sodium chloride 0.9 % bolus 1,000 mL (1,000 mLs Intravenous New Bag/Given 06/28/21 0528)  ondansetron (ZOFRAN) injection 4 mg (4 mg Intravenous Given 06/28/21 0528)  HYDROmorphone (DILAUDID) injection 0.5 mg (0.5 mg Intravenous Given 06/28/21 0529)  iohexol (OMNIPAQUE) 300 MG/ML solution 100 mL (100 mLs Intravenous Contrast Given 06/28/21 0645)  HYDROmorphone (DILAUDID) injection 1 mg (1 mg Intravenous Given 06/28/21 0735)  ketorolac (TORADOL) 15 MG/ML injection 15 mg (15 mg Intravenous Given 06/28/21 0739)     Procedures  /  Critical Care Procedures  ED Course and Medical Decision Making  Initial Impression and Ddx Differential diagnosis includes pain due to endometriosis, appendicitis, diverticulitis, gastroenteritis.  Awaiting labs, hCG, suspect will need CT imaging.  Past medical/surgical history that increases complexity of ED encounter: Endometriosis  Interpretation of Diagnostics Laboratory assessment is reassuring, no significant electrolyte disturbance or blood count abnormalities.  CT scan revealing likely worsening endometriosis causing her pain.    Patient Reassessment and Ultimate Disposition/Management Providing further pain control.  Dr. Elonda Husky of GYN was consulted and will be happy to see her in the office.  Signed out to oncoming provider.  Patient management required discussion with the following services or consulting groups:   OB/GYN  Complexity of Problems Addressed Acute illness or injury that poses threat of life of bodily function  Additional Data Reviewed and Analyzed Further history obtained from: Past medical history and medications listed in the EMR and Prior ED visit notes  Factors Impacting ED Encounter Risk Prescriptions and Consideration of hospitalization  Barth Kirks. Sedonia Small, [place] [email or web address]  Final Clinical Impressions(s) / ED Diagnoses     ICD-10-CM   1. Endometriosis  N80.9       ED Discharge Orders  Ordered    oxyCODONE (ROXICODONE) 5 MG immediate release tablet  Every 4 hours PRN        06/28/21 0731             Discharge Instructions Discussed with and Provided to Patient:     Discharge Instructions      You were evaluated in the Emergency Department and after careful evaluation, we did not find any emergent condition requiring admission or further testing in the hospital.  Your exam/testing today was overall reassuring.  Symptoms likely due to endometriosis.  Recommend Tylenol 1000 mg every 4-6 hours and/or Motrin 600 mg every 4-6 hours for pain.  You can use the oxycodone medication for more significant pain.  Recommend follow-up with the OB/GYN doctors.  We discussed your case with Dr. Excell Seltzer, they will be expecting your call to schedule an appointment.  Please return to the Emergency Department if you experience any worsening of your condition.  Thank you for allowing [name] to be a part of your care.        Maudie Flakes, MD 06/28/21 [phone]

## 2021-06-28 NOTE — ED Provider Notes (Signed)
Care of patient assumed from Dr. Sedonia Small at 7:30 AM.  This patient has 3 days of abdominal pain secondary to endometriosis.  She is currently undergoing second dose of pain medication.  If pain control is achieved, she can be discharged with OB follow-up. Physical Exam  BP 126/82    Pulse 93    Temp 98.9 F (37.2 C) (Oral)    Resp 16    Ht 5\' 4"  (1.626 m)    Wt 70.3 kg    SpO2 95%    BMI 26.61 kg/m   Physical Exam Constitutional:      General: She is not in acute distress.    Appearance: She is well-developed and normal weight. She is not ill-appearing, toxic-appearing or diaphoretic.  Cardiovascular:     Rate and Rhythm: Normal rate and regular rhythm.  Pulmonary:     Effort: Pulmonary effort is normal.  Skin:    General: Skin is warm and dry.  Neurological:     General: No focal deficit present.     Mental Status: She is alert and oriented to person, place, and time.  Psychiatric:        Mood and Affect: Mood normal.        Behavior: Behavior normal.    Procedures  Procedures  ED Course / MDM    Medical Decision Making Amount and/or Complexity of Data Reviewed Labs: ordered. Radiology: ordered.  Risk Prescription drug management.   On assessment, patient is sitting up in bed, resting comfortably.  She is well-appearing.  She reports that she has had improved pain which is now 3/10 in severity.  She was advised to continue ibuprofen at home.  As needed narcotic pain medication and Zofran was prescribed as well.  Patient to follow-up with OB/GYN for further management of her endometriosis.  Contact information was provided.  She was discharged in stable condition.       Godfrey Pick, MD 06/28/21 [phone]

## 2021-06-28 NOTE — ED Notes (Signed)
Patient given water to encourage fluids.

## 2021-06-28 NOTE — ED Triage Notes (Signed)
Pt c/o generalized abd pain for the past 3 days. Pt also c/o nausea and not being able to eat very much for the past few days.

## 2021-06-28 NOTE — Discharge Instructions (Addendum)
You were evaluated in the Emergency Department and after careful evaluation, we did not find any emergent condition requiring admission or further testing in the hospital.  Your exam/testing today was overall reassuring.  Symptoms likely due to endometriosis.  Recommend Tylenol 1000 mg every 4-6 hours and/or Motrin 600 mg every 4-6 hours for pain.  You can use the oxycodone medication for more significant pain.  Recommend follow-up with the OB/GYN doctors.  We discussed your case with Dr. Excell Seltzer, they will be expecting your call to schedule an appointment.  Please return to the Emergency Department if you experience any worsening of your condition.  Thank you for allowing [name] to be a part of your care.

## 2021-06-29 ENCOUNTER — Telehealth: Payer: Self-pay | Admitting: *Deleted

## 2021-06-29 NOTE — Telephone Encounter (Signed)
Transition Care Management Follow-up Telephone Call Date of discharge and from where: 06/28/2021 - [facility] How have you been since you were released from the hospital? "Still in a little bit of pain" Any questions or concerns? No  Items Reviewed: Did the pt receive and understand the discharge instructions provided? Yes  Medications obtained and verified? Yes  Other? No  Any new allergies since your discharge? No  Dietary orders reviewed? No Do you have support at home? Yes    Functional Questionnaire: (I = Independent and D = Dependent) ADLs: I  Bathing/Dressing- I  Meal Prep- I  Eating- I  Maintaining continence- I  Transferring/Ambulation- I  Managing Meds- I  Follow up appointments reviewed:  PCP Hospital f/u appt confirmed? No   Specialist Hospital f/u appt confirmed? Yes  Scheduled to see GYN on 07/07/2021 @ 0950. Are transportation arrangements needed? No  If their condition worsens, is the pt aware to call PCP or go to the Emergency Dept.? Yes Was the patient provided with contact information for the PCP's office or ED? Yes Was to pt encouraged to call back with questions or concerns? Yes

## 2021-07-07 ENCOUNTER — Ambulatory Visit: Payer: Medicaid Other | Admitting: Obstetrics & Gynecology

## 2021-07-07 ENCOUNTER — Other Ambulatory Visit: Payer: Self-pay

## 2021-07-07 ENCOUNTER — Encounter: Payer: Self-pay | Admitting: Obstetrics & Gynecology

## 2021-07-07 VITALS — BP 129/88 | HR 74 | Ht 64.0 in | Wt 156.0 lb

## 2021-07-07 DIAGNOSIS — R102 Pelvic and perineal pain: Secondary | ICD-10-CM | POA: Diagnosis not present

## 2021-07-07 DIAGNOSIS — N80129 Deep endometriosis of ovary, unspecified ovary: Secondary | ICD-10-CM | POA: Diagnosis not present

## 2021-07-07 MED ORDER — MEGESTROL ACETATE 40 MG PO TABS: 40.0000 mg | ORAL_TABLET | Freq: Every day | ORAL | 11 refills | Status: DC

## 2021-07-07 NOTE — Progress Notes (Signed)
Follow up appointment for results  Chief Complaint  Patient presents with   Endometriosis   Rusty unfortunately has a long history of endometriosis including robotically assisted bilateral ovarian cystectomies due to large bilateral endometriomas performed by Dr. Everitt Amber in December 05, 2018.  At that time she had a 17 cm left ovarian endometrioma and a 10 cm right ovarian endometrioma.  She had a sonogram done October 21 which showed a small endometrioma on the right and a normal left ovary She had a CT scan performed last week due to pelvic pain and that is attached below  Between that surgery and now she had a femara induced cycle spontaneous intercourse successful pregnancy but unfortunately had an early pregnancy loss  Dr. Kerin Perna was managing this and did her D&C  He then placed her on Orilissa therapy but she did poorly on that, felt terrible with swelling, and she stopped it but has not done anything since then which has been about 7 months  She was experiencing pelvic pain and went to the emergency department had a CT scan which showed the relatively small bilateral endometriomas  Patient is aware and I strongly reemphasized the fact that she either has to be pregnant, actively getting pregnant, or being aggressively suppressed.  She is aware  With that said I will put her on megestrol therapy today since she did so poorly on the [facility] antagonist therapy.  Megestrol is not ideal but should appropriately suppress her endometriosis and endometriomas while she decides about future pregnancy timing.  Blood pressure 129/88, pulse 74, height 5\' 4"  (1.626 m), weight 156 lb (70.8 kg), last menstrual period 06/14/2021.   CLINICAL DATA:  3 day history of abdominal pain with nausea.   EXAM: CT ABDOMEN AND PELVIS WITH CONTRAST   TECHNIQUE: Multidetector CT imaging of the abdomen and pelvis was performed using the standard protocol following bolus administration of intravenous  contrast.   RADIATION DOSE REDUCTION: This exam was performed according to the departmental dose-optimization program which includes automated exposure control, adjustment of the mA and/or kV according to patient size and/or use of iterative reconstruction technique.   CONTRAST:  193mL OMNIPAQUE IOHEXOL 300 MG/ML  SOLN   COMPARISON:  11/10/2020   FINDINGS: Lower chest: Unremarkable.   Hepatobiliary: No suspicious focal abnormality within the liver parenchyma. There is no evidence for gallstones, gallbladder wall thickening, or pericholecystic fluid. No intrahepatic or extrahepatic biliary dilation.   Pancreas: No focal mass lesion. No dilatation of the main duct. No intraparenchymal cyst. No peripancreatic edema.   Spleen: No splenomegaly. No focal mass lesion.   Adrenals/Urinary Tract: No adrenal nodule or mass. Kidneys unremarkable. No evidence for hydroureter. The urinary bladder appears normal for the degree of distention.   Stomach/Bowel: Stomach is unremarkable. No gastric wall thickening. No evidence of outlet obstruction. Duodenum is normally positioned as is the ligament of Treitz. No small bowel wall thickening. No small bowel dilatation. The terminal ileum is normal. Appendix is nondilated. Colon is nondilated. Thin linear bands of soft tissue attenuation are again noted around the distal small bowel and right colon, remarkably similar to the prior study from 11/10/2020   Vascular/Lymphatic: No abdominal aortic aneurysm. There is no gastrohepatic or hepatoduodenal ligament lymphadenopathy. No retroperitoneal or mesenteric lymphadenopathy. No pelvic sidewall lymphadenopathy.   Reproductive: Prominent cystic lesion associated with posterior cervix is probably a nabothian cyst. Similar bilateral complex cystic adnexal lesions measuring 4.5 x 3.6 on the right (image 66/2) and 4.3 x 3.9 cm on  the left (65/2).   Other: No intraperitoneal free fluid. Stable 10 mm  high attenuation nodule just lateral to the cecum on 58/2. 9 mm high attenuation soft tissue nodule seen along the peritoneum of the posterior pelvis on 56/2, just anterior to the right psoas muscle.   Musculoskeletal: No worrisome lytic or sclerotic osseous abnormality.   IMPRESSION: 1. 1. No evidence for bowel obstruction. 2. Bilateral multicystic adnexal lesions which appear adherent to the posterior uterine fundus. Given the history of grade 4 endometriosis, these may well represent endometriomas. Tubo-ovarian abscess could have a similar imaging appearance on CT. 3. Thin linear bands of soft tissue attenuation are seen in the anterior mesentery of the low pelvis and tracking around the terminal ileum and right colon, remarkably similar to prior study. As this is associated with multiple relatively high attenuation stable peritoneal implants in the right pelvis, endometriosis is a favored etiology by imaging.     Electronically Signed   By: Misty Stanley M.D.   On: 06/28/2021 07:11   MEDS ordered this encounter: Meds ordered this encounter  Medications   megestrol (MEGACE) 40 MG tablet    Sig: Take 1 tablet (40 mg total) by mouth daily.    Dispense:  30 tablet    Refill:  11    Orders for this encounter: No orders of the defined types were placed in this encounter.   Impression:   ICD-10-CM   1. Endometrioma of both ovaries  N80.129     2. Pelvic pain in female  R10.2        Plan: Begin daily megestrol therapy for suppression of endometriosis We will follow-up in 3 months and see how she is doing on that as well as her intentions regarding actively seeking pregnancy  It is also to be remembered she has antiphospholipid antibody syndrome and will be placed on Lovenox 40 mg daily when she achieves pregnancy  All questions were answered and she agrees with this management plan in this very complicated and challenging patient  Follow Up: Return in about 3  months (around 10/04/2021) for Follow up, with Dr Elonda Husky.       All questions were answered.  Past Medical History:  Diagnosis Date   Allergy    skin sensitivity - itching   Anxiety    Dermatographia    Endometriosis    Stage 4    Past Surgical History:  Procedure Laterality Date   FRACTURE SURGERY     LEFT ARM   ROBOTIC ASSISTED LAPAROSCOPIC OVARIAN CYSTECTOMY Bilateral 12/05/2018   Procedure: XI ROBOTIC ASSISTED LAPAROSCOPIC BILATERAL OVARIAN CYSTECTOMY, LYSIS OF ADHESIONS;  Surgeon: Everitt Amber, MD;  Location: [facility];  Service: Gynecology;  Laterality: Bilateral;   WISDOM TOOTH EXTRACTION      OB History     Gravida  1   Para      Term      Preterm      AB  1   Living         SAB  1   IAB      Ectopic      Multiple      Live Births              No Known Allergies  Social History   Socioeconomic History   Marital status: Married    Spouse name: Not on file   Number of children: Not on file   Years of education: Not on file   Highest education level:  Not on file  Occupational History   Not on file  Tobacco Use   Smoking status: Never   Smokeless tobacco: Never  Vaping Use   Vaping Use: Never used  Substance and Sexual Activity   Alcohol use: Yes    Comment: occ glass of wine   Drug use: No   Sexual activity: Yes    Birth control/protection: None    Comment: last period 02-13-20.  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   Social Determinants of Health   Financial Resource Strain: Not on file  Food Insecurity: Not on file  Transportation Needs: Not on file  Physical Activity: Not on file  Stress: Not on file  Social Connections: Not on file    Family History  Problem Relation Age of Onset   Breast cancer Paternal Aunt    Other Paternal Grandmother        swelling in lymph nodes   Arthritis Maternal Grandmother    Heart attack Maternal  Grandfather    Colon cancer Neg Hx    Pancreatic cancer Neg Hx    Esophageal cancer Neg Hx    Rectal cancer Neg Hx    Stomach cancer Neg Hx

## 2021-07-21 ENCOUNTER — Encounter: Payer: Self-pay | Admitting: Obstetrics & Gynecology

## 2021-07-26 ENCOUNTER — Other Ambulatory Visit: Payer: Self-pay | Admitting: Obstetrics & Gynecology

## 2021-07-26 MED ORDER — MEGESTROL ACETATE 40 MG PO TABS: 40.0000 mg | ORAL_TABLET | Freq: Two times a day (BID) | ORAL | 11 refills | Status: DC

## 2021-07-26 NOTE — Addendum Note (Signed)
Addended by: Florian Buff on: 07/26/2021 09:19 PM ? ? Modules accepted: Orders ? ?

## 2021-08-25 MED ORDER — LUPRON DEPOT (3-MONTH) 11.25 MG IM KIT: 11.2500 mg | PACK | INTRAMUSCULAR | 1 refills | Status: DC

## 2021-08-25 NOTE — Addendum Note (Signed)
Addended by: Florian Buff on: 08/25/2021 10:25 AM ? ? Modules accepted: Orders ? ?

## 2021-09-01 ENCOUNTER — Ambulatory Visit (INDEPENDENT_AMBULATORY_CARE_PROVIDER_SITE_OTHER): Payer: Medicaid Other

## 2021-09-01 DIAGNOSIS — N809 Endometriosis, unspecified: Secondary | ICD-10-CM

## 2021-09-01 MED ORDER — LEUPROLIDE ACETATE (3 MONTH) 11.25 MG IM KIT
11.2500 mg | PACK | Freq: Once | INTRAMUSCULAR | Status: AC
  Administered 2021-09-01: 11.25 mg via INTRAMUSCULAR

## 2021-09-01 MED ORDER — LEUPROLIDE ACETATE 3.75 MG IM KIT
3.7500 mg | PACK | Freq: Once | INTRAMUSCULAR | Status: DC
Start: 2021-09-01 — End: 2021-09-01

## 2021-09-01 NOTE — Progress Notes (Signed)
? ?  NURSE VISIT- INJECTION ? ?SUBJECTIVE:  ?Natalie Price is a 31 y.o. G52P0010 female here for Lupron injection for endometriosis She is a GYN patient.  ? ?OBJECTIVE:  ?There were no vitals taken for this visit.  ?Appears well, in no apparent distress ? ?Injection administered in: Left upper quad. gluteus ? ?No orders of the defined types were placed in this encounter. ? ? ?ASSESSMENT: ?GYN patient for Lupron injection for endometriosis ?PLAN: ?Follow-up:  3 months for next injection   ? ?Sherol Sabas A Kymere Fullington  ?09/01/2021 ?11:31 AM ? ?

## 2021-10-05 ENCOUNTER — Ambulatory Visit: Payer: Medicaid Other | Admitting: Obstetrics & Gynecology

## 2021-10-16 ENCOUNTER — Other Ambulatory Visit: Payer: Self-pay | Admitting: Family

## 2021-10-16 DIAGNOSIS — Z91030 Bee allergy status: Secondary | ICD-10-CM

## 2021-10-16 MED ORDER — EPINEPHRINE 0.3 MG/0.3ML IJ SOAJ: 0.3000 mg | INTRAMUSCULAR | 1 refills | Status: DC | PRN

## 2021-11-13 ENCOUNTER — Encounter: Payer: Self-pay | Admitting: Obstetrics & Gynecology

## 2021-12-03 ENCOUNTER — Ambulatory Visit (INDEPENDENT_AMBULATORY_CARE_PROVIDER_SITE_OTHER): Payer: Medicaid Other | Admitting: *Deleted

## 2021-12-03 DIAGNOSIS — N809 Endometriosis, unspecified: Secondary | ICD-10-CM

## 2021-12-03 MED ORDER — LEUPROLIDE ACETATE (3 MONTH) 11.25 MG IM KIT
11.2500 mg | PACK | Freq: Once | INTRAMUSCULAR | Status: AC
  Administered 2021-12-03: 11.25 mg via INTRAMUSCULAR

## 2022-01-29 ENCOUNTER — Ambulatory Visit: Payer: Medicaid Other | Admitting: Obstetrics & Gynecology

## 2022-02-03 IMAGING — CT CT ABD-PELV W/ CM
2 of 4 series · 16 of 46 positions shown, 18 images · IV contrast (Omnipaque or Isovue)
Comparison: CT 11/01/2018

CLINICAL DATA: Fever recent D and C, history of endometriosis

EXAM:
CT ABDOMEN AND PELVIS WITH CONTRAST
TECHNIQUE: Multidetector CT imaging of the abdomen and pelvis was performed
using the standard protocol following bolus administration of
intravenous contrast.
CONTRAST:  75mL OMNIPAQUE IOHEXOL 300 MG/ML  SOLN

[Series 5: coronal st · coronal · 0.69mm/px · 3 of 86 slices shown]
[im 29/86  soft-tissue]
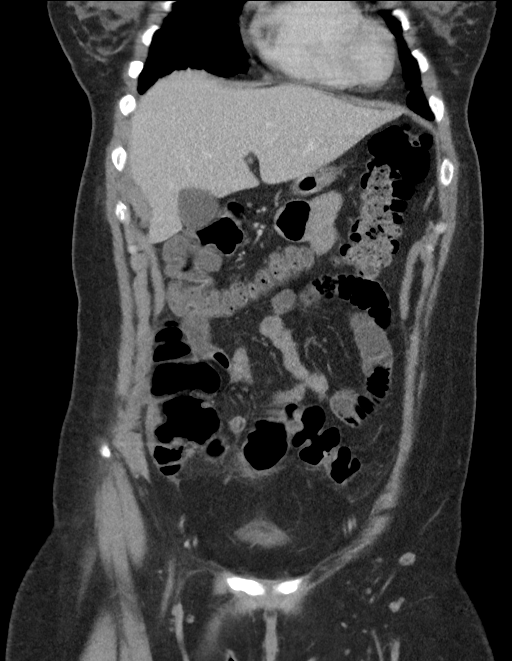
[im 38/86  soft-tissue]
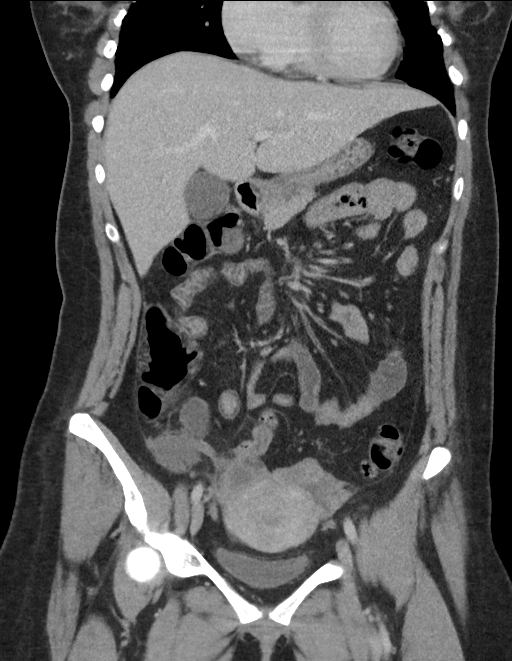
[im 48/86  soft-tissue]
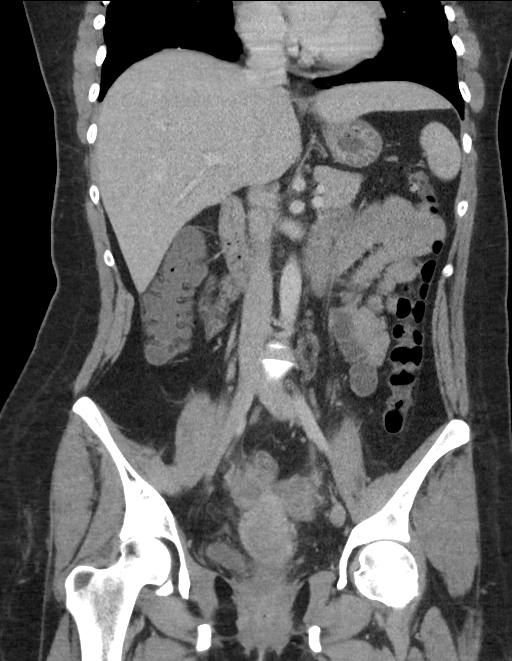

[Series 7: delay · axial · delayed · 0.67mm/px · z∈[+796,+1191]mm · 13 of 93 slices shown, 15 images]
[im 7/93  soft-tissue]
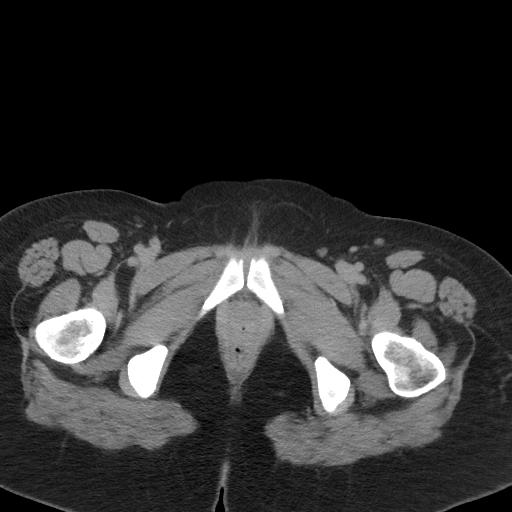
[im 7/93  bone]
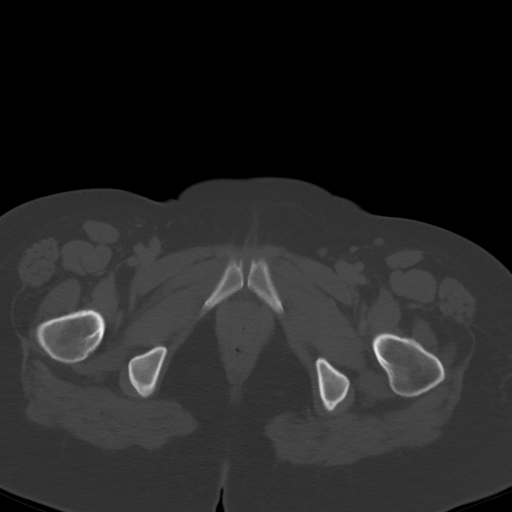
[im 13/93  soft-tissue]
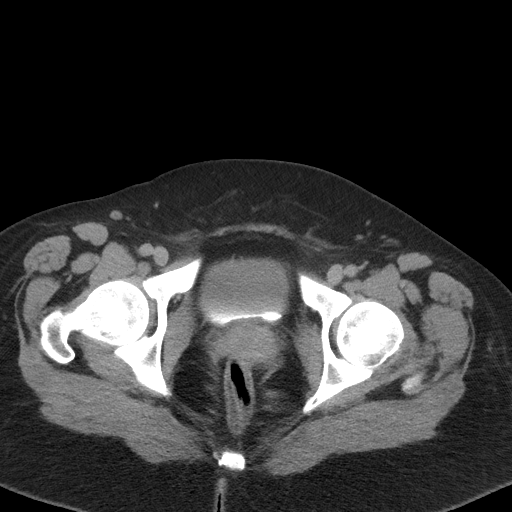
[im 19/93  soft-tissue]
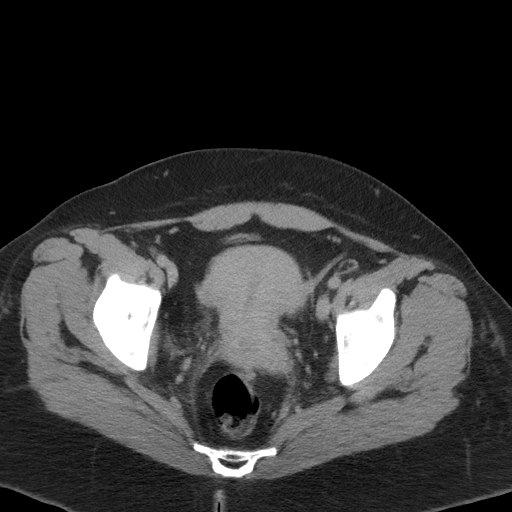
[im 25/93  soft-tissue]
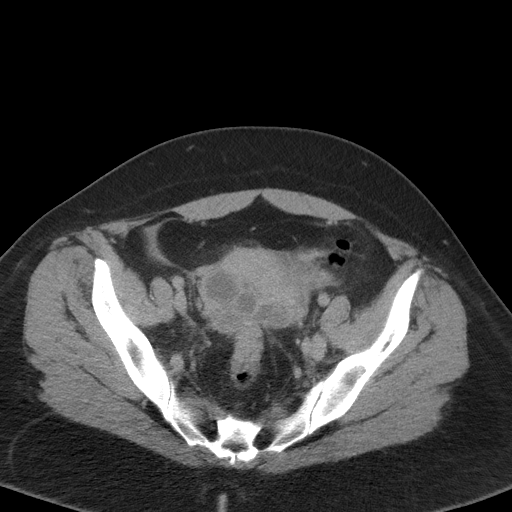
[im 31/93  soft-tissue]
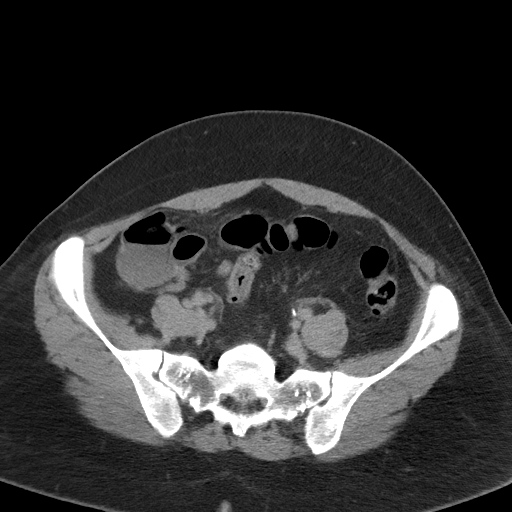
[im 37/93  soft-tissue]
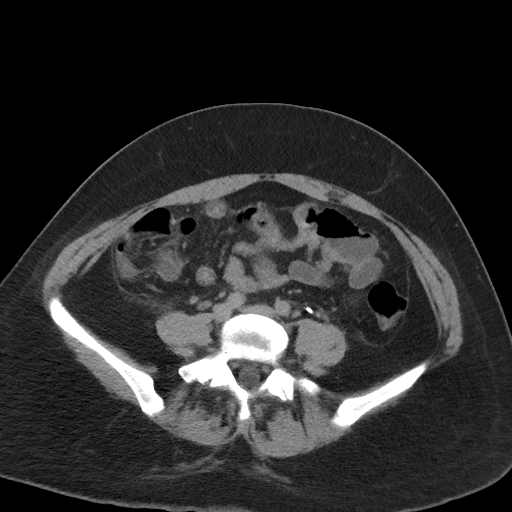
[im 50/93  soft-tissue]
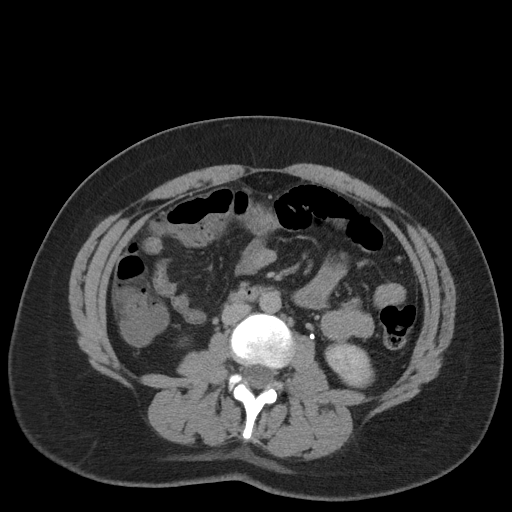
[im 56/93  soft-tissue]
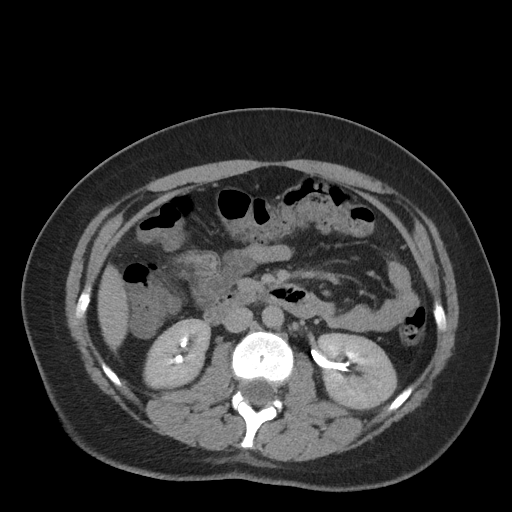
[im 62/93  soft-tissue]
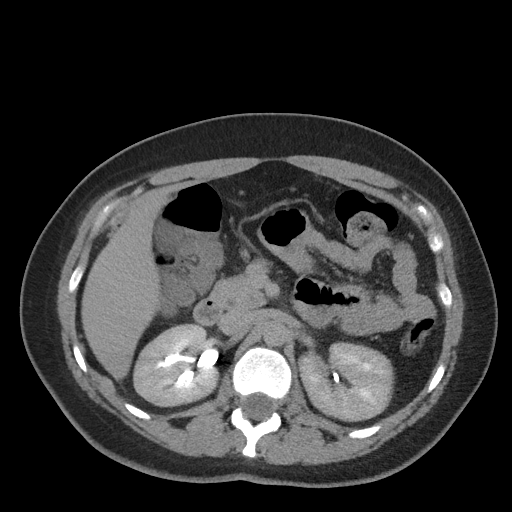
[im 62/93  bone]
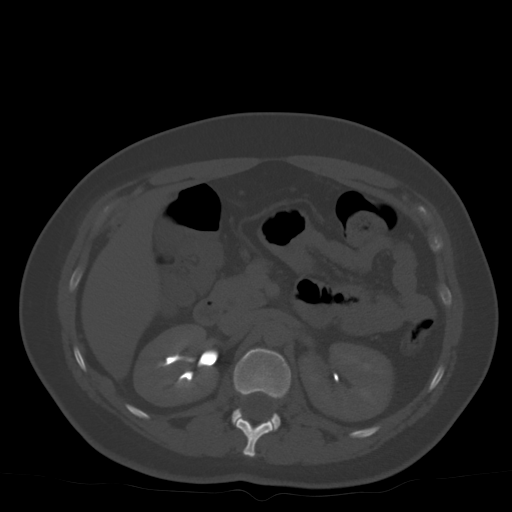
[im 68/93  soft-tissue]
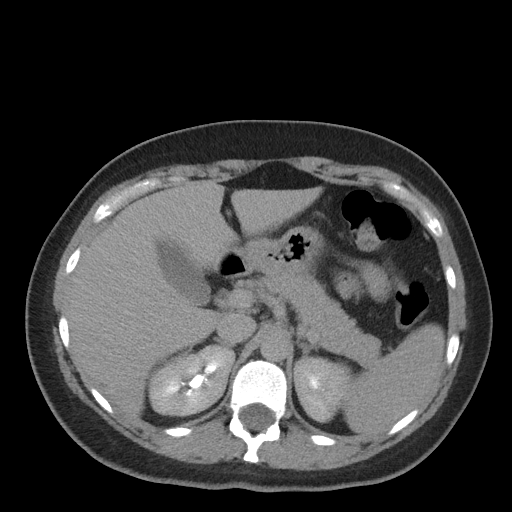
[im 74/93  soft-tissue]
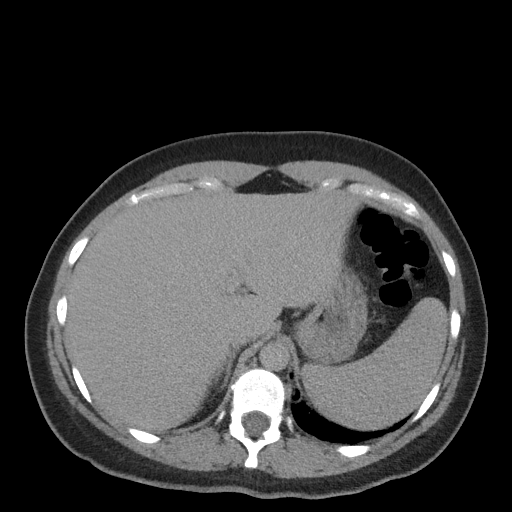
[im 80/93  soft-tissue]
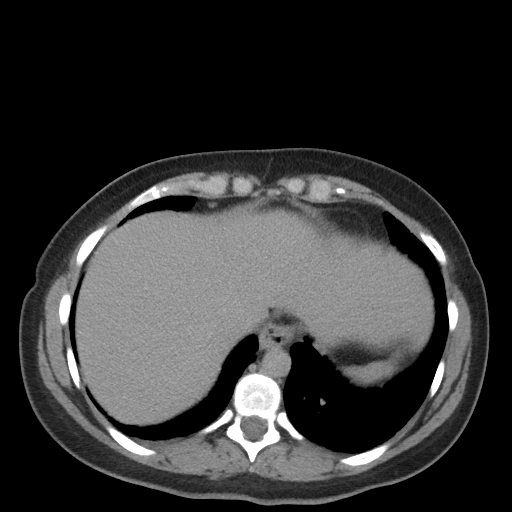
[im 86/93  soft-tissue]
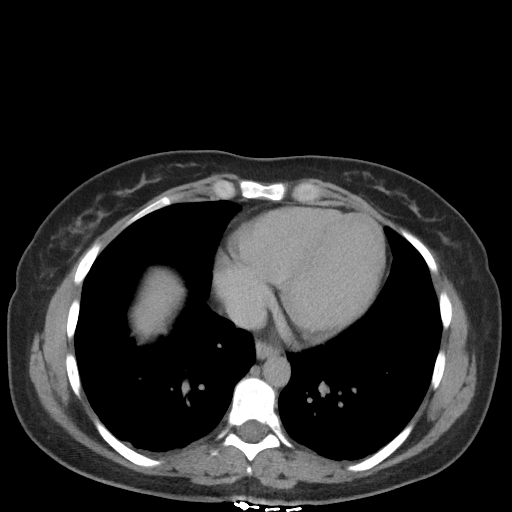

[16 of 46 positions shown; findings below may reference images not displayed]

FINDINGS: Lower chest: Lung bases demonstrate no acute consolidation or
pleural effusion. Normal cardiac size.

Hepatobiliary: No focal liver abnormality is seen. No gallstones,
gallbladder wall thickening, or biliary dilatation.

Pancreas: Unremarkable. No pancreatic ductal dilatation or
surrounding inflammatory changes.

Spleen: Normal in size without focal abnormality.

Adrenals/Urinary Tract: Adrenal glands are unremarkable. Kidneys are
normal, without renal calculi, focal lesion, or hydronephrosis.
Bladder is unremarkable.

Stomach/Bowel: Stomach is nonenlarged. No dilated small bowel. Fluid
in the right colon. Negative appendix.

Vascular/Lymphatic: Nonaneurysmal aorta.  No suspicious nodes.

Reproductive: No intrauterine gas collections. Multi loculated
complex cysts within the bilateral adnexa and posterior to the
uterus. This measures 6.5 cm transverse by 2.9 cm AP. Mild
associated inflammatory process.

Other: No pelvic effusion. No free air. Thickening in the right
colic gutter with tiny hyperdense nodules, for example 5 mm nodule,
series 2, image 53, and 11 mm nodule, series 2, image 59.

Musculoskeletal: No acute or suspicious osseous abnormality.
IMPRESSION: 1. Multi loculated cystic abnormality within the pelvis, involves
the bilateral adnexa and extends posterior to the uterus, there is
mild associated stranding/inflammation. Differential considerations
include endometrioma versus tubo-ovarian abscess given history of
fever. Consider correlation with pelvic ultrasound.
2. Mild soft tissue thickening along the right colic gutter with
small hyperdense nodules, question endometrial implants. Nonemergent
pelvic MRI could be considered for further evaluation.

## 2022-02-15 ENCOUNTER — Ambulatory Visit (INDEPENDENT_AMBULATORY_CARE_PROVIDER_SITE_OTHER): Payer: Medicaid Other | Admitting: Obstetrics & Gynecology

## 2022-02-15 ENCOUNTER — Encounter: Payer: Self-pay | Admitting: Obstetrics & Gynecology

## 2022-02-15 VITALS — BP 131/87 | HR 67 | Ht 64.0 in | Wt 152.0 lb

## 2022-02-15 DIAGNOSIS — N809 Endometriosis, unspecified: Secondary | ICD-10-CM | POA: Diagnosis not present

## 2022-02-15 DIAGNOSIS — N80129 Deep endometriosis of ovary, unspecified ovary: Secondary | ICD-10-CM

## 2022-02-15 NOTE — Progress Notes (Signed)
Follow up appointment for response: Lupron for 6 months(will be expired around October 10th)  Chief Complaint  Patient presents with   Follow-up    On lupron    Blood pressure 131/87, pulse 67, height '5\' 4"'$  (1.626 m), weight 152 lb (68.9 kg).  Great response No pain at all No dyspareunia No bleeding Vasomotor symptoms yes  Abdomen is soft completely non tender  MEDS ordered this encounter: No orders of the defined types were placed in this encounter.   Orders for this encounter: No orders of the defined types were placed in this encounter.   Impression + Management Plan   ICD-10-CM   1. Endometriosis  N80.9    Lupron x 6 months with great response, no pain, no bleeding, wants to actively get pregnant.  So nexstellis x1 month 02/1022-->begin femara on menses    2. Endometrioma of both ovaries  N80.129       Follow Up: Return for send me MyChart message when finishes BCP and start menses-->femara OI.     All questions were answered.  Past Medical History:  Diagnosis Date   Allergy    skin sensitivity - itching   Anxiety    Dermatographia    Endometriosis    Stage 4    Past Surgical History:  Procedure Laterality Date   FRACTURE SURGERY     LEFT ARM   ROBOTIC ASSISTED LAPAROSCOPIC OVARIAN CYSTECTOMY Bilateral 12/05/2018   Procedure: XI ROBOTIC ASSISTED LAPAROSCOPIC BILATERAL OVARIAN CYSTECTOMY, LYSIS OF ADHESIONS;  Surgeon: Everitt Amber, MD;  Location: [facility];  Service: Gynecology;  Laterality: Bilateral;   WISDOM TOOTH EXTRACTION      OB History     Gravida  1   Para      Term      Preterm      AB  1   Living         SAB  1   IAB      Ectopic      Multiple      Live Births              No Known Allergies  Social History   Socioeconomic History   Marital status: Married    Spouse name: Not on file   Number of children: Not on file   Years of education: Not on file   Highest education level: Not on file  Occupational  History   Not on file  Tobacco Use   Smoking status: Never   Smokeless tobacco: Never  Vaping Use   Vaping Use: Never used  Substance and Sexual Activity   Alcohol use: Yes    Comment: occ glass of wine   Drug use: No   Sexual activity: Yes    Birth control/protection: None    Comment: last period 02-13-20.  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

## 2022-03-23 ENCOUNTER — Encounter: Payer: Self-pay | Admitting: Obstetrics & Gynecology

## 2022-03-24 MED ORDER — LETROZOLE 2.5 MG PO TABS: 2.5000 mg | ORAL_TABLET | Freq: Every day | ORAL | 3 refills | Status: DC

## 2022-04-22 ENCOUNTER — Other Ambulatory Visit: Payer: Self-pay | Admitting: Family

## 2022-04-28 ENCOUNTER — Other Ambulatory Visit: Payer: Self-pay | Admitting: Obstetrics & Gynecology

## 2022-04-28 ENCOUNTER — Encounter: Payer: Self-pay | Admitting: Obstetrics & Gynecology

## 2022-04-28 MED ORDER — LETROZOLE 2.5 MG PO TABS: ORAL_TABLET | ORAL | 3 refills | Status: DC

## 2022-04-28 NOTE — Addendum Note (Signed)
Addended by: Florian Buff on: 04/28/2022 09:40 AM   Modules accepted: Orders

## 2022-05-02 MED ORDER — MEDROXYPROGESTERONE ACETATE 10 MG PO TABS: 10.0000 mg | ORAL_TABLET | Freq: Every day | ORAL | 11 refills | Status: DC

## 2022-05-02 NOTE — Addendum Note (Signed)
Addended by: Florian Buff on: 05/02/2022 08:40 PM   Modules accepted: Orders

## 2022-05-06 MED ORDER — IBUPROFEN 800 MG PO TABS: 800.0000 mg | ORAL_TABLET | Freq: Three times a day (TID) | ORAL | 3 refills | Status: DC | PRN

## 2022-06-03 ENCOUNTER — Encounter: Payer: Self-pay | Admitting: Obstetrics & Gynecology

## 2022-06-15 ENCOUNTER — Ambulatory Visit: Payer: Medicaid Other | Admitting: Obstetrics & Gynecology

## 2022-06-15 ENCOUNTER — Encounter: Payer: Self-pay | Admitting: Obstetrics & Gynecology

## 2022-06-15 VITALS — BP 135/87 | HR 67 | Ht 64.0 in | Wt 159.0 lb

## 2022-06-15 DIAGNOSIS — Z79811 Long term (current) use of aromatase inhibitors: Secondary | ICD-10-CM | POA: Diagnosis not present

## 2022-06-15 DIAGNOSIS — N809 Endometriosis, unspecified: Secondary | ICD-10-CM

## 2022-06-15 NOTE — Progress Notes (Signed)
Follow up appointment for results: POC sonogram done for follicular development On Femara S/P lupron x 6 monthsx  Chief Complaint  Patient presents with   POC follicle scan    Blood pressure 135/87, pulse 67, height '5\' 4"'$  (1.626 m), weight 159 lb (72.1 kg), last menstrual period 12/75/1700.  22 mm follicle noted on the left ovary, no other cystic lesions noted Right ovary is normal no follicular cysts >17 mm noted ES 8 mm    MEDS ordered this encounter: No orders of the defined types were placed in this encounter.   Orders for this encounter: No orders of the defined types were placed in this encounter.   Impression + Management Plan   ICD-10-CM   1. Endometriosis  N80.9     2. Use of letrozole (Femara) for ovulation induction  Z79.811       Follow Up: No follow-ups on file.     All questions were answered.  Past Medical History:  Diagnosis Date   Allergy    skin sensitivity - itching   Anxiety    Dermatographia    Endometriosis    Stage 4    Past Surgical History:  Procedure Laterality Date   FRACTURE SURGERY     LEFT ARM   ROBOTIC ASSISTED LAPAROSCOPIC OVARIAN CYSTECTOMY Bilateral 12/05/2018   Procedure: XI ROBOTIC ASSISTED LAPAROSCOPIC BILATERAL OVARIAN CYSTECTOMY, LYSIS OF ADHESIONS;  Surgeon: Everitt Amber, MD;  Location: [facility];  Service: Gynecology;  Laterality: Bilateral;   WISDOM TOOTH EXTRACTION      OB History     Gravida  1   Para      Term      Preterm      AB  1   Living         SAB  1   IAB      Ectopic      Multiple      Live Births              No Known Allergies  Social History   Socioeconomic History   Marital status: Married    Spouse name: Not on file   Number of children: Not on file   Years of education: Not on file   Highest education level: Not on file  Occupational History   Not on file  Tobacco Use   Smoking status: Never   Smokeless tobacco: Never  Vaping Use   Vaping Use: Never used   Substance and Sexual Activity   Alcohol use: Yes    Comment: occ glass of wine   Drug use: No   Sexual activity: Yes    Birth control/protection: None    Comment: last period 02-13-20.  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[employer]   Social Determinants of Health   Financial Resource Strain: Not on file  Food Insecurity: Not on file  Transportation Needs: Not on file  Physical Activity: Not on file  Stress: Not on file  Social Connections: Not on file    Family History  Problem Relation Age of Onset   Breast cancer Paternal Aunt    Other Paternal Grandmother        swelling in lymph nodes   Arthritis Maternal Grandmother    Heart attack Maternal Grandfather    Colon cancer Neg Hx    Pancreatic cancer Neg Hx    Esophageal cancer Neg Hx  Rectal cancer Neg Hx    Stomach cancer Neg Hx

## 2022-07-15 ENCOUNTER — Ambulatory Visit: Payer: Medicaid Other | Admitting: Obstetrics & Gynecology

## 2022-07-28 ENCOUNTER — Encounter: Payer: Self-pay | Admitting: Obstetrics & Gynecology

## 2022-08-19 MED ORDER — NORETHINDRONE 0.35 MG PO TABS: ORAL_TABLET | ORAL | 11 refills | Status: DC

## 2022-08-19 NOTE — Addendum Note (Signed)
Addended by: Florian Buff on: 08/19/2022 10:33 PM   Modules accepted: Orders

## 2022-09-21 IMAGING — CT CT ABD-PELV W/ CM
2 of 3 series · 15 of 46 positions shown, 17 images · IV contrast (Omnipaque or Isovue)
Comparison: 11/10/2020

CLINICAL DATA: 3 day history of abdominal pain with nausea.

EXAM:
CT ABDOMEN AND PELVIS WITH CONTRAST
TECHNIQUE: Multidetector CT imaging of the abdomen and pelvis was performed
using the standard protocol following bolus administration of
intravenous contrast.

[Series 2: axial st · axial · 0.74mm/px · z∈[+557,+962]mm · 12 of 95 slices shown, 14 images]
[im 7/95  soft-tissue]
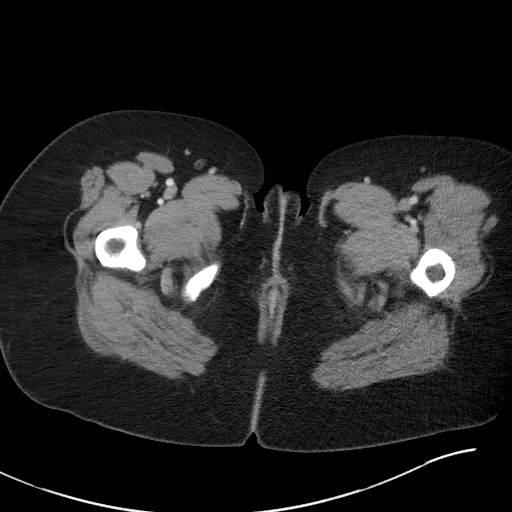
[im 7/95  bone]
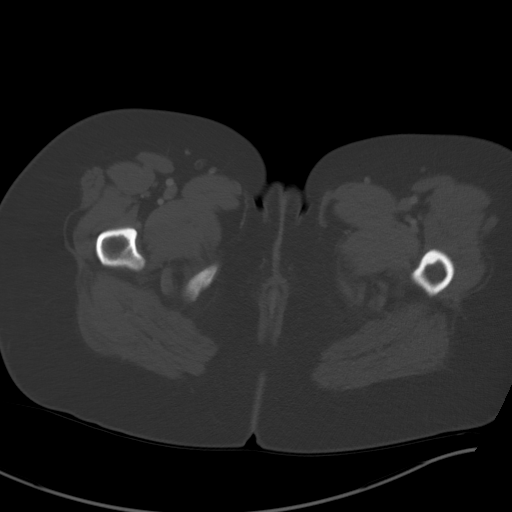
[im 13/95  soft-tissue]
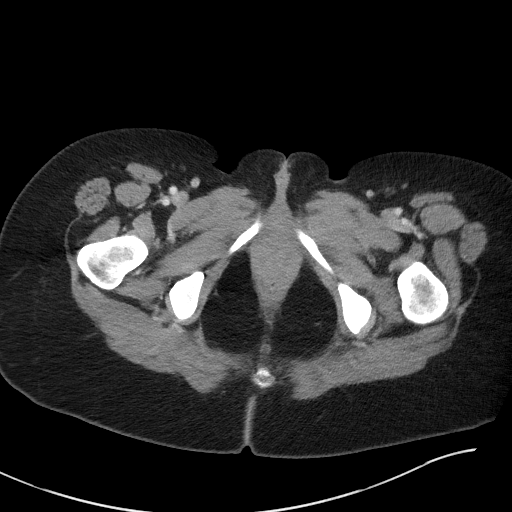
[im 22/95  soft-tissue]
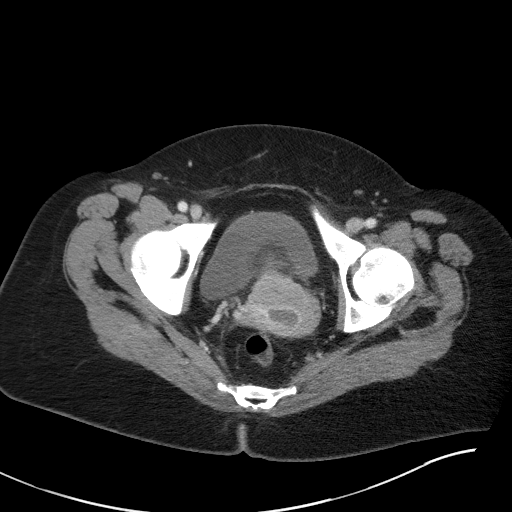
[im 28/95  soft-tissue]
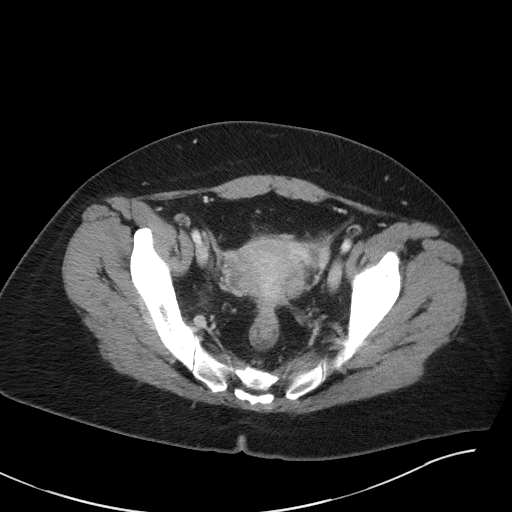
[im 37/95  soft-tissue]
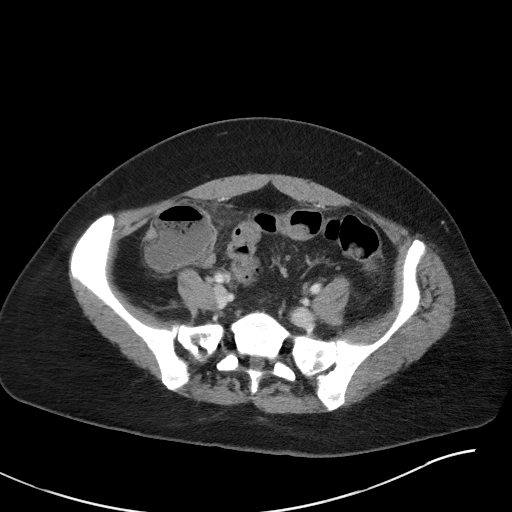
[im 43/95  soft-tissue]
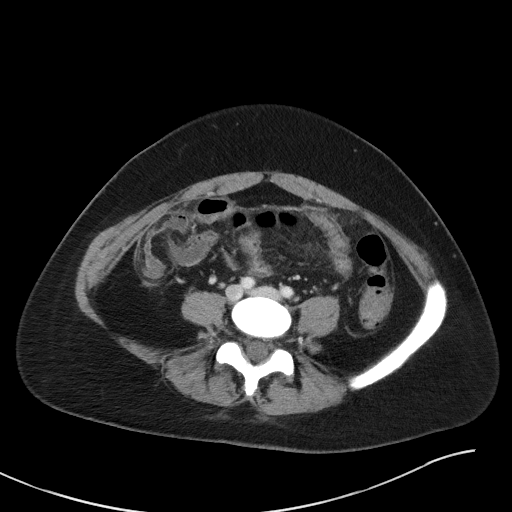
[im 52/95  soft-tissue]
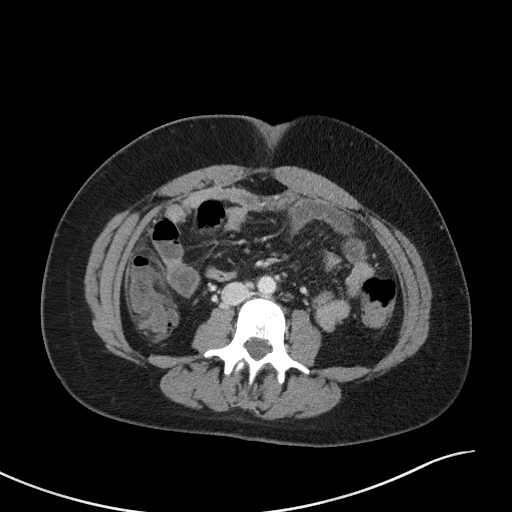
[im 58/95  soft-tissue]
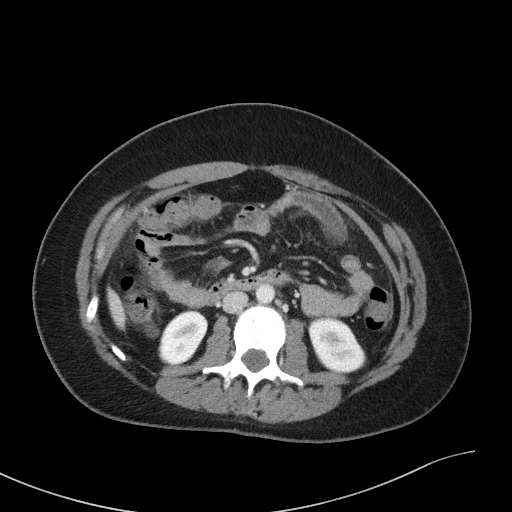
[im 67/95  soft-tissue]
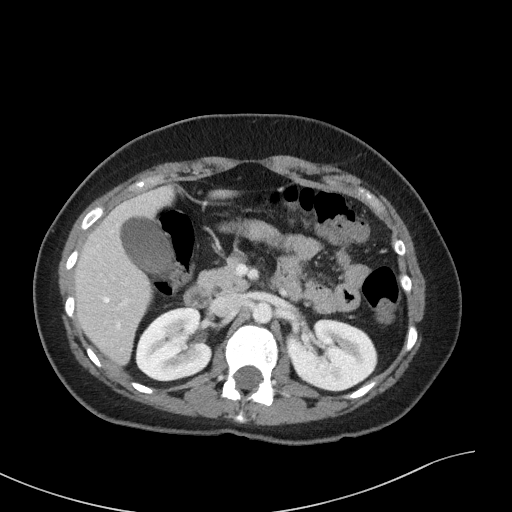
[im 67/95  bone]
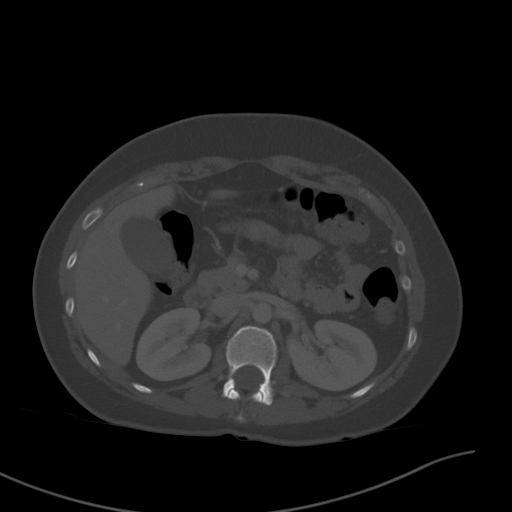
[im 73/95  soft-tissue]
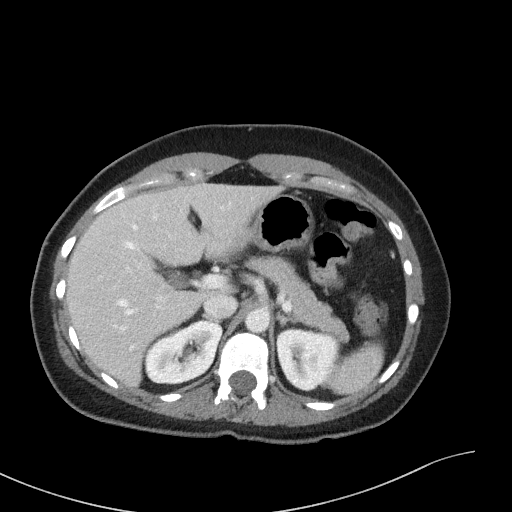
[im 82/95  soft-tissue]
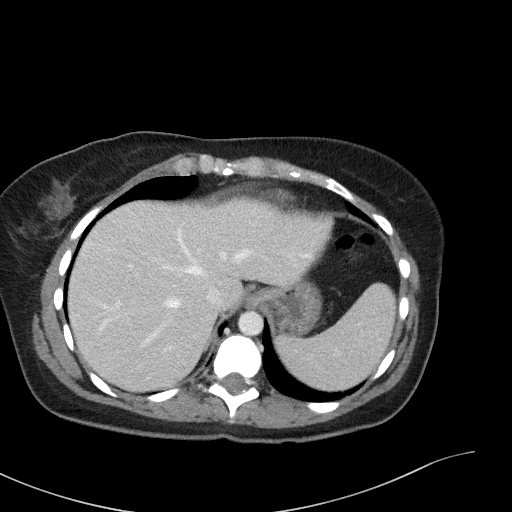
[im 88/95  soft-tissue]
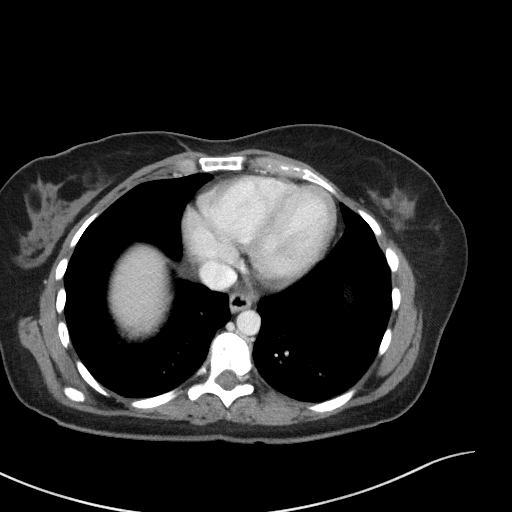

[Series 5: coronal st · coronal · 0.83mm/px · 3 of 113 slices shown]
[im 38/113  soft-tissue]
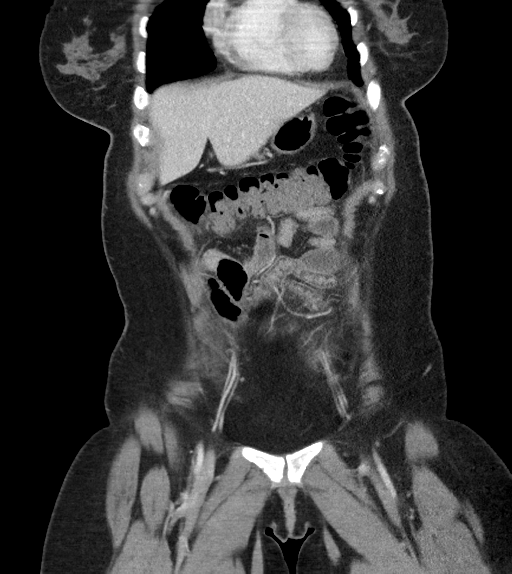
[im 50/113  soft-tissue]
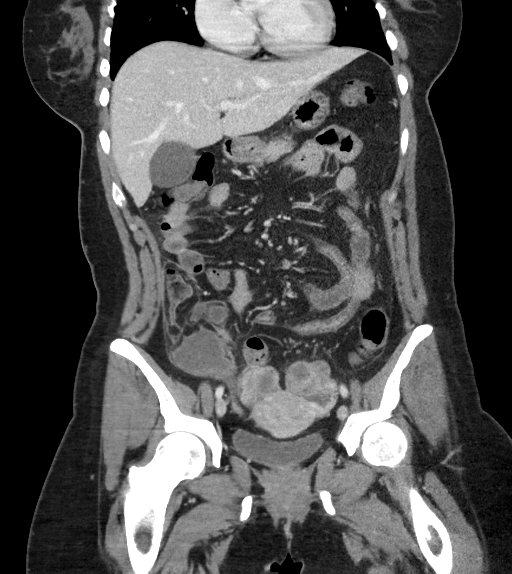
[im 63/113  soft-tissue]
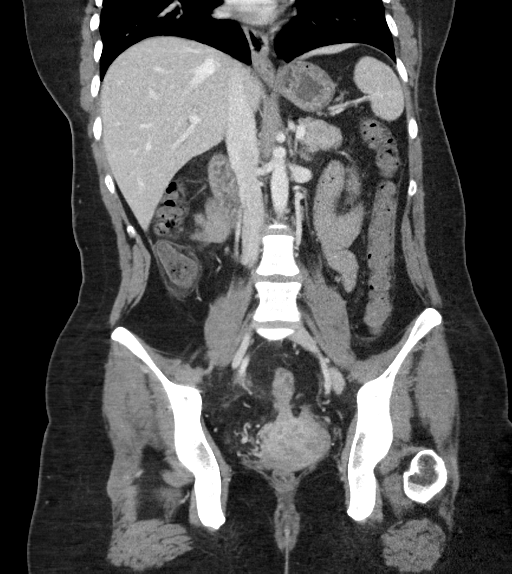

[15 of 46 positions shown; findings below may reference images not displayed]

RADIATION DOSE REDUCTION: This exam was performed according to the
departmental dose-optimization program which includes automated
exposure control, adjustment of the mA and/or kV according to
patient size and/or use of iterative reconstruction technique.

CONTRAST:  100mL OMNIPAQUE IOHEXOL 300 MG/ML  SOLN
FINDINGS: Lower chest: Unremarkable.

Hepatobiliary: No suspicious focal abnormality within the liver
parenchyma. There is no evidence for gallstones, gallbladder wall
thickening, or pericholecystic fluid. No intrahepatic or
extrahepatic biliary dilation.

Pancreas: No focal mass lesion. No dilatation of the main duct. No
intraparenchymal cyst. No peripancreatic edema.

Spleen: No splenomegaly. No focal mass lesion.

Adrenals/Urinary Tract: No adrenal nodule or mass. Kidneys
unremarkable. No evidence for hydroureter. The urinary bladder
appears normal for the degree of distention.

Stomach/Bowel: Stomach is unremarkable. No gastric wall thickening.
No evidence of outlet obstruction. Duodenum is normally positioned
as is the ligament of Treitz. No small bowel wall thickening. No
small bowel dilatation. The terminal ileum is normal. Appendix is
nondilated. Colon is nondilated. Thin linear bands of soft tissue
attenuation are again noted around the distal small bowel and right
colon, remarkably similar to the prior study from 11/10/2020

Vascular/Lymphatic: No abdominal aortic aneurysm. There is no
gastrohepatic or hepatoduodenal ligament lymphadenopathy. No
retroperitoneal or mesenteric lymphadenopathy. No pelvic sidewall
lymphadenopathy.

Reproductive: Prominent cystic lesion associated with posterior
cervix is probably a nabothian cyst. Similar bilateral complex
cystic adnexal lesions measuring 4.5 x 3.6 on the right (image 66/2)
and 4.3 x 3.9 cm on the left (65/2).

Other: No intraperitoneal free fluid. Stable 10 mm high attenuation
nodule just lateral to the cecum on 58/2. 9 mm high attenuation soft
tissue nodule seen along the peritoneum of the posterior pelvis on
[DATE], just anterior to the right psoas muscle.

Musculoskeletal: No worrisome lytic or sclerotic osseous
abnormality.
IMPRESSION: 1.
1. No evidence for bowel obstruction.
2. Bilateral multicystic adnexal lesions which appear adherent to
the posterior uterine fundus. Given the history of grade 4
endometriosis, these may well represent endometriomas. Tubo-ovarian
abscess could have a similar imaging appearance on CT.
3. Thin linear bands of soft tissue attenuation are seen in the
anterior mesentery of the low pelvis and tracking around the
terminal ileum and right colon, remarkably similar to prior study.
As this is associated with multiple relatively high attenuation
stable peritoneal implants in the right pelvis, endometriosis is a
favored etiology by imaging.

## 2022-11-05 ENCOUNTER — Telehealth: Payer: Self-pay | Admitting: Family

## 2022-11-15 ENCOUNTER — Encounter: Payer: Self-pay | Admitting: Family

## 2022-11-15 ENCOUNTER — Telehealth (INDEPENDENT_AMBULATORY_CARE_PROVIDER_SITE_OTHER): Payer: Medicaid Other | Admitting: Family

## 2022-11-15 ENCOUNTER — Telehealth: Payer: Self-pay | Admitting: Family

## 2022-11-15 DIAGNOSIS — F900 Attention-deficit hyperactivity disorder, predominantly inattentive type: Secondary | ICD-10-CM

## 2022-11-15 DIAGNOSIS — F411 Generalized anxiety disorder: Secondary | ICD-10-CM | POA: Diagnosis not present

## 2022-11-15 MED ORDER — LISDEXAMFETAMINE DIMESYLATE 20 MG PO CAPS: 20.0000 mg | ORAL_CAPSULE | Freq: Every day | ORAL | 0 refills | Status: DC

## 2022-11-15 NOTE — Telephone Encounter (Signed)
Patient would like to have lisdexamfetamine (VYVANSE) 20 MG capsule sent to [facility] in [place] and canceled at [place],

## 2022-11-15 NOTE — Progress Notes (Signed)
. Virtual Visit Consent   Natalie Price, you are scheduled for a virtual visit with a [facility] provider today. Just as with appointments in the office, your consent must be obtained to participate. Your consent will be active for this visit and any virtual visit you may have with one of our providers in the next 365 days. If you have a MyChart account, a copy of this consent can be sent to you electronically.  As this is a virtual visit, video technology does not allow for your provider to perform a traditional examination. This may limit your provider's ability to fully assess your condition. If your provider identifies any concerns that need to be evaluated in person or the need to arrange testing (such as labs, EKG, etc.), we will make arrangements to do so. Although advances in technology are sophisticated, we cannot ensure that it will always work on either your end or our end. If the connection with a video visit is poor, the visit may have to be switched to a telephone visit. With either a video or telephone visit, we are not always able to ensure that we have a secure connection.  By engaging in this virtual visit, you consent to the provision of healthcare and authorize for your insurance to be billed (if applicable) for the services provided during this visit. Depending on your insurance coverage, you may receive a charge related to this service.  I need to obtain your verbal consent now. Are you willing to proceed with your visit today? Natalie Price has provided verbal consent on 11/15/2022 for a virtual visit (video or telephone). Jannifer Rodney, FNP  Date: 11/15/2022 12:46 PM  Virtual Visit via Video Note   I, Jannifer Rodney, connected with  Natalie Price  ([id number], 06/14/1990) on 11/15/22 at 11:55 AM EDT by a video-enabled telemedicine application and verified that I am speaking with the correct person using two identifiers.  Location: Patient: Virtual Visit Location Patient:  Home Provider: Virtual Visit Location Provider: Office/Clinic   I discussed the limitations of evaluation and management by telemedicine and the availability of in person appointments. The patient expressed understanding and agreed to proceed.    History of Present Illness: Natalie Price is a 32 y.o. who identifies as a female who was assigned female at birth, and is being seen today for ADHD evaluation. Reports she is constantly over thinking simple things. Having a hard time stay focus and completing tasks. Having a hard time remembering to pay her bills. Reports she gets over stimulated.  Reports she starts multiple hobbies, but never completes any of them.   HPI: Anxiety Presents for follow-up visit. Symptoms include excessive worry, nervous/anxious behavior and restlessness. Symptoms occur most days. The severity of symptoms is moderate.      Problems:  Patient Active Problem List   Diagnosis Date Noted   GAD (generalized anxiety disorder) 01/15/2021   Depression, major, single episode, moderate (HCC) 01/15/2021   Chronic bilateral low back pain 01/15/2021   Hematochezia 11/06/2019   Endometriosis of ovary    Endometriosis of rectovaginal septum    Ovarian cyst, bilateral 11/17/2018   Elevated cancer antigen 125 (CA 125) 11/17/2018    Allergies: No Known Allergies Medications:  Current Outpatient Medications:    lisdexamfetamine (VYVANSE) 20 MG capsule, Take 1 capsule (20 mg total) by mouth daily before breakfast., Disp: 30 capsule, Rfl: 0   [START ON 12/15/2022] lisdexamfetamine (VYVANSE) 20 MG capsule, Take 1 capsule (20 mg total) by mouth daily before  breakfast., Disp: 30 capsule, Rfl: 0   [START ON 01/14/2023] lisdexamfetamine (VYVANSE) 20 MG capsule, Take 1 capsule (20 mg total) by mouth daily before breakfast., Disp: 30 capsule, Rfl: 0   busPIRone (BUSPAR) 10 MG tablet, Take 1 tablet (10 mg total) by mouth 3 (three) times daily. (Patient taking differently: Take 10 mg by  mouth as needed.), Disp: 180 tablet, Rfl: 2   EPINEPHrine 0.3 mg/0.3 mL IJ SOAJ injection, Inject 0.3 mg into the muscle as needed for anaphylaxis. (Patient not taking: Reported on 06/15/2022), Disp: 1 each, Rfl: 1   Multiple Vitamin (MULTIVITAMIN) tablet, Take 1 tablet by mouth daily., Disp: , Rfl:    norethindrone (MICRONOR) 0.35 MG tablet, 1 daily, do not take inactive pills, go straight to the next pack, Disp: 28 tablet, Rfl: 11  Observations/Objective: Patient is well-developed, well-nourished in no acute distress.  Resting comfortably  at home.  Head is normocephalic, atraumatic.  No labored breathing.  Speech is clear and coherent with logical content.  Patient is alert and oriented at baseline.    Assessment and Plan: 1. GAD (generalized anxiety disorder)  2. Attention deficit hyperactivity disorder (ADHD), predominantly inattentive type - lisdexamfetamine (VYVANSE) 20 MG capsule; Take 1 capsule (20 mg total) by mouth daily before breakfast.  Dispense: 30 capsule; Refill: 0 - lisdexamfetamine (VYVANSE) 20 MG capsule; Take 1 capsule (20 mg total) by mouth daily before breakfast.  Dispense: 30 capsule; Refill: 0 - lisdexamfetamine (VYVANSE) 20 MG capsule; Take 1 capsule (20 mg total) by mouth daily before breakfast.  Dispense: 30 capsule; Refill: 0  Start Vyvanse 20 mg  Stress management  Meds as prescribed Behavior modification as needed Follow-up for recheck in 3  months in person   Follow Up Instructions: I discussed the assessment and treatment plan with the patient. The patient was provided an opportunity to ask questions and all were answered. The patient agreed with the plan and demonstrated an understanding of the instructions.  A copy of instructions were sent to the patient via MyChart unless otherwise noted below.     The patient was advised to call back or seek an in-person evaluation if the symptoms worsen or if the condition fails to improve as  anticipated.  Time:  I spent 16 minutes with the patient via telehealth technology discussing the above problems/concerns.    Jannifer Rodney, FNP

## 2022-11-16 NOTE — Telephone Encounter (Signed)
Done.  Emilina Smarr, FNP  

## 2022-12-20 ENCOUNTER — Encounter: Payer: Self-pay | Admitting: Obstetrics & Gynecology

## 2023-01-25 ENCOUNTER — Other Ambulatory Visit: Payer: Self-pay | Admitting: Family

## 2023-02-15 ENCOUNTER — Encounter: Payer: Self-pay | Admitting: Family

## 2023-02-15 ENCOUNTER — Ambulatory Visit: Payer: Medicaid Other | Admitting: Family

## 2023-02-15 VITALS — BP 128/88 | HR 80 | Temp 97.1°F | Ht 64.0 in | Wt 146.8 lb

## 2023-02-15 DIAGNOSIS — F411 Generalized anxiety disorder: Secondary | ICD-10-CM | POA: Diagnosis not present

## 2023-02-15 DIAGNOSIS — Z0001 Encounter for general adult medical examination with abnormal findings: Secondary | ICD-10-CM

## 2023-02-15 DIAGNOSIS — Z0000 Encounter for general adult medical examination without abnormal findings: Secondary | ICD-10-CM

## 2023-02-15 DIAGNOSIS — F900 Attention-deficit hyperactivity disorder, predominantly inattentive type: Secondary | ICD-10-CM | POA: Diagnosis not present

## 2023-02-15 MED ORDER — LISDEXAMFETAMINE DIMESYLATE 30 MG PO CAPS: 30.0000 mg | ORAL_CAPSULE | Freq: Every day | ORAL | 0 refills | Status: DC

## 2023-02-15 MED ORDER — FLUOXETINE HCL 40 MG PO CAPS: 40.0000 mg | ORAL_CAPSULE | Freq: Every day | ORAL | 3 refills | Status: DC

## 2023-02-15 NOTE — Patient Instructions (Signed)

## 2023-02-15 NOTE — Progress Notes (Signed)
Subjective:    Patient ID: Natalie Price, female    DOB: 1990/12/25, 32 y.o.   MRN: [id number]  Chief Complaint  Patient presents with   Medical Management of Chronic Issues    d    Pt presents to the office today for CPE and ADHD evaluation. She was seen and started on Vyvanse 20 mg. Prior to starting medication she is constantly over thinking simple things. Having a hard time stay focus and completing tasks. Having a hard time remembering to pay her bills. Reports she gets over stimulated.  Reports she starts multiple hobbies, but never completes any of them.   Reports since start medication she is able to stay focused and completing task. She would like to try to increase slightly to help.  Anxiety Presents for follow-up visit. Symptoms include decreased concentration, excessive worry, nervous/anxious behavior, obsessions, palpitations and restlessness. Symptoms occur occasionally. The severity of symptoms is moderate.        Review of Systems  Cardiovascular:  Positive for palpitations.  Psychiatric/Behavioral:  Positive for decreased concentration. The patient is nervous/anxious.   All other systems reviewed and are negative.  Family History  Problem Relation Age of Onset   Breast cancer Paternal Aunt    Other Paternal Grandmother        swelling in lymph nodes   Arthritis Maternal Grandmother    Heart attack Maternal Grandfather    Colon cancer Neg Hx    Pancreatic cancer Neg Hx    Esophageal cancer Neg Hx    Rectal cancer Neg Hx    Stomach cancer Neg Hx    Social History   Socioeconomic History   Marital status: Married    Spouse name: Not on file   Number of children: Not on file   Years of education: Not on file   Highest education level: Not on file  Occupational History   Not on file  Tobacco Use   Smoking status: Never   Smokeless tobacco: Never  Vaping Use   Vaping status: Never Used  Substance and Sexual Activity   Alcohol use: Yes    Comment:  occ glass of wine   Drug use: No   Sexual activity: Yes    Birth control/protection: None    Comment: last period 02-13-20.  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[employer]   Social Determinants of Health   Financial Resource Strain: Not on file  Food Insecurity: Not on file  Transportation Needs: Not on file  Physical Activity: Not on file  Stress: Not on file  Social Connections: Not on file       Objective:   Physical Exam Vitals reviewed.  Constitutional:      General: She is not in acute distress.    Appearance: She is well-developed.  HENT:     Head: Normocephalic and atraumatic.     Right Ear: Tympanic membrane normal.     Left Ear: Tympanic membrane normal.  Eyes:     Pupils: Pupils are equal, round, and reactive to light.  Neck:     Thyroid: No thyromegaly.  Cardiovascular:     Rate and Rhythm: Normal rate and regular rhythm.     Heart sounds: Normal heart sounds. No murmur heard. Pulmonary:     Effort: Pulmonary effort is normal. No respiratory distress.     Breath sounds: Normal breath sounds. No wheezing.  Abdominal:  General: Bowel sounds are normal. There is no distension.     Palpations: Abdomen is soft.     Tenderness: There is no abdominal tenderness.  Musculoskeletal:        General: No tenderness. Normal range of motion.     Cervical back: Normal range of motion and neck supple.  Skin:    General: Skin is warm and dry.  Neurological:     Mental Status: She is alert and oriented to person, place, and time.     Cranial Nerves: No cranial nerve deficit.     Deep Tendon Reflexes: Reflexes are normal and symmetric.  Psychiatric:        Behavior: Behavior normal.        Thought Content: Thought content normal.        Judgment: Judgment normal.    BP 128/88   Pulse 80   Temp (!) 97.1 F (36.2 C) (Temporal)   Ht 5\' 4"  (1.626 m)   Wt 146 lb 12.8 oz (66.6 kg)   SpO2 100%    BMI 25.20 kg/m        Assessment & Plan:   Natalie Price comes in today with chief complaint of Medical Management of Chronic Issues (d)   Diagnosis and orders addressed:  1. Attention deficit hyperactivity disorder (ADHD), predominantly inattentive type Will increase Vyvanse to 30 mg from 20 mg  Meds as prescribed Behavior modification as needed Follow-up for recheck in 3 months - ToxASSURE Select 13 (MW), Urine - lisdexamfetamine (VYVANSE) 30 MG capsule; Take 1 capsule (30 mg total) by mouth daily before breakfast.  Dispense: 30 capsule; Refill: 0 - lisdexamfetamine (VYVANSE) 30 MG capsule; Take 1 capsule (30 mg total) by mouth daily before breakfast.  Dispense: 30 capsule; Refill: 0 - lisdexamfetamine (VYVANSE) 30 MG capsule; Take 1 capsule (30 mg total) by mouth daily before breakfast.  Dispense: 30 capsule; Refill: 0 - CMP14+EGFR  2. Annual physical exam - ToxASSURE Select 13 (MW), Urine - CBC with Differential/Platelet - CMP14+EGFR - Lipid panel - TSH  3. GAD (generalized anxiety disorder) - CMP14+EGFR   Labs pending Patient reviewed in [place] controlled database, no flags noted. Contract and drug screen are up to date.  Continue current medications  Health Maintenance reviewed Diet and exercise encouraged  Follow up plan: 3 months   Jannifer Rodney, FNP

## 2023-02-16 LAB — CBC WITH DIFFERENTIAL/PLATELET
Basophils Absolute: 0 10*3/uL (ref 0.0–0.2)
Basos: 0 %
EOS (ABSOLUTE): 0.1 10*3/uL (ref 0.0–0.4)
Eos: 2 %
Hematocrit: 42.3 % (ref 34.0–46.6)
Hemoglobin: 14.3 g/dL (ref 11.1–15.9)
Immature Grans (Abs): 0 10*3/uL (ref 0.0–0.1)
Immature Granulocytes: 0 %
Lymphocytes Absolute: 1.5 10*3/uL (ref 0.7–3.1)
Lymphs: 27 %
MCH: 34.5 pg — ABNORMAL HIGH (ref 26.6–33.0)
MCHC: 33.8 g/dL (ref 31.5–35.7)
MCV: 102 fL — ABNORMAL HIGH (ref 79–97)
Monocytes Absolute: 0.5 10*3/uL (ref 0.1–0.9)
Monocytes: 8 %
Neutrophils Absolute: 3.5 10*3/uL (ref 1.4–7.0)
Neutrophils: 63 %
Platelets: 282 10*3/uL (ref 150–450)
RBC: 4.14 x10E6/uL (ref 3.77–5.28)
RDW: 12.5 % (ref 11.7–15.4)
WBC: 5.6 10*3/uL (ref 3.4–10.8)

## 2023-02-16 LAB — CMP14+EGFR
ALT: 20 IU/L (ref 0–32)
AST: 17 IU/L (ref 0–40)
Albumin: 4.4 g/dL (ref 3.9–4.9)
Alkaline Phosphatase: 64 IU/L (ref 44–121)
BUN/Creatinine Ratio: 15 (ref 9–23)
BUN: 11 mg/dL (ref 6–20)
Bilirubin Total: 0.4 mg/dL (ref 0.0–1.2)
CO2: 23 mmol/L (ref 20–29)
Calcium: 9.4 mg/dL (ref 8.7–10.2)
Chloride: 103 mmol/L (ref 96–106)
Creatinine, Ser: 0.71 mg/dL (ref 0.57–1.00)
Globulin, Total: 2.3 g/dL (ref 1.5–4.5)
Glucose: 90 mg/dL (ref 70–99)
Potassium: 4 mmol/L (ref 3.5–5.2)
Sodium: 139 mmol/L (ref 134–144)
Total Protein: 6.7 g/dL (ref 6.0–8.5)
eGFR: 115 mL/min/{1.73_m2} (ref >59)

## 2023-02-16 LAB — LIPID PANEL
Chol/HDL Ratio: 2.1 ratio (ref 0.0–4.4)
Cholesterol, Total: 173 mg/dL (ref 100–199)
HDL: 81 mg/dL (ref >39)
LDL Chol Calc (NIH): 81 mg/dL (ref 0–99)
Triglycerides: 55 mg/dL (ref 0–149)
VLDL Cholesterol Cal: 11 mg/dL (ref 5–40)

## 2023-02-16 LAB — TSH: TSH: 1.41 u[IU]/mL (ref 0.450–4.500)

## 2023-02-17 LAB — TOXASSURE SELECT 13 (MW), URINE

## 2023-03-22 ENCOUNTER — Encounter: Payer: Self-pay | Admitting: Obstetrics & Gynecology

## 2023-03-23 ENCOUNTER — Other Ambulatory Visit: Payer: Self-pay | Admitting: Obstetrics & Gynecology

## 2023-03-23 MED ORDER — IBUPROFEN 800 MG PO TABS: 800.0000 mg | ORAL_TABLET | Freq: Three times a day (TID) | ORAL | 1 refills | Status: DC | PRN

## 2023-05-19 ENCOUNTER — Telehealth (INDEPENDENT_AMBULATORY_CARE_PROVIDER_SITE_OTHER): Payer: Medicaid Other | Admitting: Family

## 2023-05-19 ENCOUNTER — Encounter: Payer: Self-pay | Admitting: Family

## 2023-05-19 DIAGNOSIS — F900 Attention-deficit hyperactivity disorder, predominantly inattentive type: Secondary | ICD-10-CM

## 2023-05-19 DIAGNOSIS — F320 Major depressive disorder, single episode, mild: Secondary | ICD-10-CM

## 2023-05-19 DIAGNOSIS — F411 Generalized anxiety disorder: Secondary | ICD-10-CM | POA: Diagnosis not present

## 2023-05-19 MED ORDER — HYDROXYZINE PAMOATE 25 MG PO CAPS: 25.0000 mg | ORAL_CAPSULE | Freq: Three times a day (TID) | ORAL | 2 refills | Status: DC | PRN

## 2023-05-19 MED ORDER — LISDEXAMFETAMINE DIMESYLATE 30 MG PO CAPS: 30.0000 mg | ORAL_CAPSULE | Freq: Every day | ORAL | 0 refills | Status: DC

## 2023-05-19 NOTE — Progress Notes (Signed)
Virtual Visit Consent   Natalie Price, you are scheduled for a virtual visit with a [facility] provider today. Just as with appointments in the office, your consent must be obtained to participate. Your consent will be active for this visit and any virtual visit you may have with one of our providers in the next 365 days. If you have a MyChart account, a copy of this consent can be sent to you electronically.  As this is a virtual visit, video technology does not allow for your provider to perform a traditional examination. This may limit your provider's ability to fully assess your condition. If your provider identifies any concerns that need to be evaluated in person or the need to arrange testing (such as labs, EKG, etc.), we will make arrangements to do so. Although advances in technology are sophisticated, we cannot ensure that it will always work on either your end or our end. If the connection with a video visit is poor, the visit may have to be switched to a telephone visit. With either a video or telephone visit, we are not always able to ensure that we have a secure connection.  By engaging in this virtual visit, you consent to the provision of healthcare and authorize for your insurance to be billed (if applicable) for the services provided during this visit. Depending on your insurance coverage, you may receive a charge related to this service.  I need to obtain your verbal consent now. Are you willing to proceed with your visit today? Natalie Price has provided verbal consent on 05/19/2023 for a virtual visit (video or telephone). Natalie Rodney, FNP  Date: 05/19/2023 10:39 AM  Virtual Visit via Video Note   I, Natalie Price, connected with  Natalie Price  ([id number], 1991-04-27) on 05/19/23 at 10:25 AM EST by a video-enabled telemedicine application and verified that I am speaking with the correct person using two identifiers.  Location: Patient: Virtual Visit Location Patient:  Home Provider: Virtual Visit Location Provider: Home Office   I discussed the limitations of evaluation and management by telemedicine and the availability of in person appointments. The patient expressed understanding and agreed to proceed.    History of Present Illness: Natalie Price is a 32 y.o. who identifies as a female who was assigned female at birth, and is being seen today for  ADHD follow up. She has been taking Vyvanse 30 mg. Prior to starting medication she is constantly over thinking simple things, having a hard time stay focus, completing tasks, and a hard time remembering to pay her bills. Reports she gets over stimulated.  Reports she starts multiple hobbies, but never completes any of them. Since starting the Vyvanse all these have improved.     HPI: Anxiety Presents for follow-up visit. Symptoms include excessive worry, nervous/anxious behavior and restlessness. Symptoms occur occasionally. The severity of symptoms is mild.    Depression        This is a chronic problem.  The current episode started more than 1 year ago.   Associated symptoms include fatigue, helplessness, hopelessness, restlessness and sad.  Past treatments include SSRIs - Selective serotonin reuptake inhibitors.  Past medical history includes anxiety.     Problems:  Patient Active Problem List   Diagnosis Date Noted   GAD (generalized anxiety disorder) 01/15/2021   Chronic bilateral low back pain 01/15/2021   Endometriosis of ovary    Endometriosis of rectovaginal septum    Ovarian cyst, bilateral 11/17/2018   Elevated cancer antigen 125 (  CA 125) 11/17/2018    Allergies: No Known Allergies Medications:  Current Outpatient Medications:    hydrOXYzine (VISTARIL) 25 MG capsule, Take 1 capsule (25 mg total) by mouth every 8 (eight) hours as needed., Disp: 30 capsule, Rfl: 2   ibuprofen (ADVIL) 800 MG tablet, Take 1 tablet (800 mg total) by mouth every 8 (eight) hours as needed., Disp: 60 tablet, Rfl:  1   busPIRone (BUSPAR) 10 MG tablet, Take 1 tablet (10 mg total) by mouth 3 (three) times daily. (Patient taking differently: Take 10 mg by mouth as needed.), Disp: 180 tablet, Rfl: 2   EPINEPHrine 0.3 mg/0.3 mL IJ SOAJ injection, Inject 0.3 mg into the muscle as needed for anaphylaxis., Disp: 1 each, Rfl: 1   FLUoxetine (PROZAC) 40 MG capsule, Take 1 capsule (40 mg total) by mouth daily., Disp: 90 capsule, Rfl: 3   lisdexamfetamine (VYVANSE) 30 MG capsule, Take 1 capsule (30 mg total) by mouth daily before breakfast., Disp: 30 capsule, Rfl: 0   [START ON 06/15/2023] lisdexamfetamine (VYVANSE) 30 MG capsule, Take 1 capsule (30 mg total) by mouth daily before breakfast., Disp: 30 capsule, Rfl: 0   [START ON 07/14/2023] lisdexamfetamine (VYVANSE) 30 MG capsule, Take 1 capsule (30 mg total) by mouth daily before breakfast., Disp: 30 capsule, Rfl: 0   Multiple Vitamin (MULTIVITAMIN) tablet, Take 1 tablet by mouth daily., Disp: , Rfl:    norethindrone (MICRONOR) 0.35 MG tablet, 1 daily, do not take inactive pills, go straight to the next pack, Disp: 28 tablet, Rfl: 11  Observations/Objective: Patient is well-developed, well-nourished in no acute distress.  Resting comfortably  at home.  Head is normocephalic, atraumatic.  No labored breathing.  Speech is clear and coherent with logical content.  Patient is alert and oriented at baseline.    Assessment and Plan: 1. Attention deficit hyperactivity disorder (ADHD), predominantly inattentive type - lisdexamfetamine (VYVANSE) 30 MG capsule; Take 1 capsule (30 mg total) by mouth daily before breakfast.  Dispense: 30 capsule; Refill: 0 - lisdexamfetamine (VYVANSE) 30 MG capsule; Take 1 capsule (30 mg total) by mouth daily before breakfast.  Dispense: 30 capsule; Refill: 0 - lisdexamfetamine (VYVANSE) 30 MG capsule; Take 1 capsule (30 mg total) by mouth daily before breakfast.  Dispense: 30 capsule; Refill: 0  2. GAD (generalized anxiety disorder)  (Primary) - hydrOXYzine (VISTARIL) 25 MG capsule; Take 1 capsule (25 mg total) by mouth every 8 (eight) hours as needed.  Dispense: 30 capsule; Refill: 2  3. Depression, major, single episode, mild (HCC) - hydrOXYzine (VISTARIL) 25 MG capsule; Take 1 capsule (25 mg total) by mouth every 8 (eight) hours as needed.  Dispense: 30 capsule; Refill: 2  Meds as prescribed Behavior modification as needed Follow-up for recheck in 3 months Will given vistaril 25 mg TID prn Follow up in 3 month    Follow Up Instructions: I discussed the assessment and treatment plan with the patient. The patient was provided an opportunity to ask questions and all were answered. The patient agreed with the plan and demonstrated an understanding of the instructions.  A copy of instructions were sent to the patient via MyChart unless otherwise noted below.     The patient was advised to call back or seek an in-person evaluation if the symptoms worsen or if the condition fails to improve as anticipated.    Natalie Rodney, FNP

## 2023-07-26 ENCOUNTER — Encounter: Payer: Self-pay | Admitting: Obstetrics & Gynecology

## 2023-07-27 ENCOUNTER — Other Ambulatory Visit: Payer: Self-pay | Admitting: Obstetrics & Gynecology

## 2023-07-27 MED ORDER — NORETHINDRONE 0.35 MG PO TABS: ORAL_TABLET | ORAL | 0 refills | Status: DC

## 2023-07-27 NOTE — Progress Notes (Signed)
 Rx for POPs- 1 mos only Pt overdue for annual  Myna Hidalgo, DO Attending Obstetrician & Gynecologist, [facility] for [employer], [facility]

## 2023-08-15 ENCOUNTER — Encounter: Payer: Self-pay | Admitting: Family

## 2023-08-15 ENCOUNTER — Ambulatory Visit: Payer: Medicaid Other | Admitting: Family

## 2023-08-15 VITALS — BP 126/80 | HR 79 | Temp 98.4°F | Ht 62.0 in | Wt 153.6 lb

## 2023-08-15 DIAGNOSIS — F909 Attention-deficit hyperactivity disorder, unspecified type: Secondary | ICD-10-CM | POA: Insufficient documentation

## 2023-08-15 DIAGNOSIS — F411 Generalized anxiety disorder: Secondary | ICD-10-CM | POA: Diagnosis not present

## 2023-08-15 DIAGNOSIS — F320 Major depressive disorder, single episode, mild: Secondary | ICD-10-CM

## 2023-08-15 DIAGNOSIS — Z91030 Bee allergy status: Secondary | ICD-10-CM | POA: Diagnosis not present

## 2023-08-15 DIAGNOSIS — F900 Attention-deficit hyperactivity disorder, predominantly inattentive type: Secondary | ICD-10-CM

## 2023-08-15 DIAGNOSIS — T148XXA Other injury of unspecified body region, initial encounter: Secondary | ICD-10-CM

## 2023-08-15 DIAGNOSIS — Z79899 Other long term (current) drug therapy: Secondary | ICD-10-CM

## 2023-08-15 MED ORDER — FLUOXETINE HCL 40 MG PO CAPS: 40.0000 mg | ORAL_CAPSULE | Freq: Every day | ORAL | 3 refills | Status: DC

## 2023-08-15 MED ORDER — LISDEXAMFETAMINE DIMESYLATE 40 MG PO CAPS: 40.0000 mg | ORAL_CAPSULE | Freq: Every day | ORAL | 0 refills | Status: DC

## 2023-08-15 MED ORDER — EPINEPHRINE 0.3 MG/0.3ML IJ SOAJ: 0.3000 mg | INTRAMUSCULAR | 1 refills | Status: AC | PRN

## 2023-08-15 MED ORDER — HYDROXYZINE PAMOATE 25 MG PO CAPS: 25.0000 mg | ORAL_CAPSULE | Freq: Three times a day (TID) | ORAL | 2 refills | Status: DC | PRN

## 2023-08-15 NOTE — Patient Instructions (Signed)

## 2023-08-15 NOTE — Progress Notes (Signed)
 Subjective:    Patient ID: Natalie Price, female    DOB: [birth year], 33 y.o.   MRN: [id number]  Chief Complaint  Patient presents with   Medical Management of Chronic Issues    Pt presents to the office today for chronic follow up and  ADHD refill. She was started on Vyvanse. Prior to starting medication she is constantly over thinking simple things. Having a hard time stay focus and completing tasks. Having a hard time remembering to pay her bills. Reports she gets over stimulated.  Reports she starts multiple hobbies, but never completes any of them.   Reports since start medication she is able to stay focused and completing task. She would like to try to increase slightly to help.   She has noticed increase bruising on lower legs. Requesting labs today.  Anxiety Presents for follow-up visit. Symptoms include decreased concentration, excessive worry, nervous/anxious behavior, obsessions, palpitations and restlessness. Symptoms occur occasionally. The severity of symptoms is moderate.        Review of Systems  Cardiovascular:  Positive for palpitations.  Psychiatric/Behavioral:  Positive for decreased concentration. The patient is nervous/anxious.   All other systems reviewed and are negative.  Family History  Problem Relation Age of Onset   Breast cancer Paternal Aunt    Other Paternal Grandmother        swelling in lymph nodes   Arthritis Maternal Grandmother    Heart attack Maternal Grandfather    Colon cancer Neg Hx    Pancreatic cancer Neg Hx    Esophageal cancer Neg Hx    Rectal cancer Neg Hx    Stomach cancer Neg Hx    Social History   Socioeconomic History   Marital status: Married    Spouse name: Not on file   Number of children: Not on file   Years of education: Not on file   Highest education level: Not on file  Occupational History   Not on file  Tobacco Use   Smoking status: Never   Smokeless tobacco: Never  Vaping Use   Vaping status: Never Used   Substance and Sexual Activity   Alcohol use: Yes    Comment: occ glass of wine   Drug use: No   Sexual activity: Yes    Birth control/protection: None    Comment: last period 02-13-20.  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[employer]   Social Drivers of Health   Financial Resource Strain: Not on file  Food Insecurity: Not on file  Transportation Needs: Not on file  Physical Activity: Not on file  Stress: Not on file  Social Connections: Not on file       Objective:   Physical Exam Vitals reviewed.  Constitutional:      General: She is not in acute distress.    Appearance: She is well-developed.  HENT:     Head: Normocephalic and atraumatic.     Right Ear: Tympanic membrane normal.     Left Ear: Tympanic membrane normal.  Eyes:     Pupils: Pupils are equal, round, and reactive to light.  Neck:     Thyroid: No thyromegaly.  Cardiovascular:     Rate and Rhythm: Normal rate and regular rhythm.     Heart sounds: Normal heart sounds. No murmur heard. Pulmonary:     Effort: Pulmonary effort is normal. No respiratory distress.     Breath sounds:  Normal breath sounds. No wheezing.  Abdominal:     General: Bowel sounds are normal. There is no distension.     Palpations: Abdomen is soft.     Tenderness: There is no abdominal tenderness.  Musculoskeletal:        General: No tenderness. Normal range of motion.     Cervical back: Normal range of motion and neck supple.  Skin:    General: Skin is warm and dry.  Neurological:     Mental Status: She is alert and oriented to person, place, and time.     Cranial Nerves: No cranial nerve deficit.     Deep Tendon Reflexes: Reflexes are normal and symmetric.  Psychiatric:        Behavior: Behavior normal.        Thought Content: Thought content normal.        Judgment: Judgment normal.    BP 126/80   Pulse 79   Temp 98.4 F (36.9 C) (Temporal)   Ht 5'  2" (1.575 m)   Wt 153 lb 9.6 oz (69.7 kg)   SpO2 96%   BMI 28.09 kg/m        Assessment & Plan:   Laquanda Bick comes in today with chief complaint of Medical Management of Chronic Issues   Diagnosis and orders addressed:  1. Bee sting allergy - EPINEPHrine 0.3 mg/0.3 mL IJ SOAJ injection; Inject 0.3 mg into the muscle as needed for anaphylaxis.  Dispense: 1 each; Refill: 1  2. GAD (generalized anxiety disorder) - hydrOXYzine (VISTARIL) 25 MG capsule; Take 1 capsule (25 mg total) by mouth every 8 (eight) hours as needed.  Dispense: 30 capsule; Refill: 2  3. Depression, major, single episode, mild (HCC) - hydrOXYzine (VISTARIL) 25 MG capsule; Take 1 capsule (25 mg total) by mouth every 8 (eight) hours as needed.  Dispense: 30 capsule; Refill: 2  4. Attention deficit hyperactivity disorder (ADHD), predominantly inattentive type -Vyvanse increased to 40 mg from 30 mg Meds as prescribed Behavior modification as needed Follow-up for recheck in 3 months - lisdexamfetamine (VYVANSE) 40 MG capsule; Take 1 capsule (40 mg total) by mouth daily before breakfast.  Dispense: 30 capsule; Refill: 0 - lisdexamfetamine (VYVANSE) 40 MG capsule; Take 1 capsule (40 mg total) by mouth daily before breakfast.  Dispense: 30 capsule; Refill: 0 - lisdexamfetamine (VYVANSE) 40 MG capsule; Take 1 capsule (40 mg total) by mouth daily before breakfast.  Dispense: 30 capsule; Refill: 0  5. Controlled substance agreement signed (Primary) - lisdexamfetamine (VYVANSE) 40 MG capsule; Take 1 capsule (40 mg total) by mouth daily before breakfast.  Dispense: 30 capsule; Refill: 0 - lisdexamfetamine (VYVANSE) 40 MG capsule; Take 1 capsule (40 mg total) by mouth daily before breakfast.  Dispense: 30 capsule; Refill: 0 - lisdexamfetamine (VYVANSE) 40 MG capsule; Take 1 capsule (40 mg total) by mouth daily before breakfast.  Dispense: 30 capsule; Refill: 0  6. Bruising - Anemia Profile B   Labs pending Patient  reviewed in [place] controlled database, no flags noted. Contract and drug screen are up to date.  Continue current medications  Health Maintenance reviewed Diet and exercise encouraged  Follow up plan: 3 months   Jannifer Rodney, FNP

## 2023-08-16 LAB — ANEMIA PROFILE B
Basophils Absolute: 0 10*3/uL (ref 0.0–0.2)
Basos: 1 %
EOS (ABSOLUTE): 0.2 10*3/uL (ref 0.0–0.4)
Eos: 3 %
Ferritin: 92 ng/mL (ref 15–150)
Folate: 9.3 ng/mL (ref >3.0)
Hematocrit: 39.1 % (ref 34.0–46.6)
Hemoglobin: 13.1 g/dL (ref 11.1–15.9)
Immature Grans (Abs): 0 10*3/uL (ref 0.0–0.1)
Immature Granulocytes: 0 %
Iron Saturation: 57 % — ABNORMAL HIGH (ref 15–55)
Iron: 161 ug/dL — ABNORMAL HIGH (ref 27–159)
Lymphocytes Absolute: 1.4 10*3/uL (ref 0.7–3.1)
Lymphs: 23 %
MCH: 33.5 pg — ABNORMAL HIGH (ref 26.6–33.0)
MCHC: 33.5 g/dL (ref 31.5–35.7)
MCV: 100 fL — ABNORMAL HIGH (ref 79–97)
Monocytes Absolute: 0.5 10*3/uL (ref 0.1–0.9)
Monocytes: 8 %
Neutrophils Absolute: 4 10*3/uL (ref 1.4–7.0)
Neutrophils: 65 %
Platelets: 289 10*3/uL (ref 150–450)
RBC: 3.91 x10E6/uL (ref 3.77–5.28)
RDW: 11.3 % — ABNORMAL LOW (ref 11.7–15.4)
Retic Ct Pct: 2.2 % (ref 0.6–2.6)
Total Iron Binding Capacity: 284 ug/dL (ref 250–450)
UIBC: 123 ug/dL — ABNORMAL LOW (ref 131–425)
Vitamin B-12: 285 pg/mL (ref 232–1245)
WBC: 6 10*3/uL (ref 3.4–10.8)

## 2023-08-29 ENCOUNTER — Encounter: Payer: Self-pay | Admitting: Obstetrics & Gynecology

## 2023-08-29 ENCOUNTER — Ambulatory Visit: Payer: Medicaid Other | Admitting: Obstetrics & Gynecology

## 2023-08-29 ENCOUNTER — Other Ambulatory Visit (HOSPITAL_COMMUNITY)
Admission: RE | Admit: 2023-08-29 | Discharge: 2023-08-29 | Disposition: A | Discharge status: Home / Self Care | Source: Ambulatory Visit | Attending: Obstetrics & Gynecology | Admitting: Obstetrics & Gynecology

## 2023-08-29 VITALS — BP 126/79 | HR 89 | Ht 64.0 in | Wt 155.0 lb

## 2023-08-29 DIAGNOSIS — Z01419 Encounter for gynecological examination (general) (routine) without abnormal findings: Secondary | ICD-10-CM | POA: Diagnosis present

## 2023-08-29 MED ORDER — NORETHINDRONE 0.35 MG PO TABS: 1.0000 | ORAL_TABLET | Freq: Every day | ORAL | 11 refills | Status: DC

## 2023-08-29 NOTE — Progress Notes (Signed)
 Subjective:     Natalie Price is a 33 y.o. female here for a routine exam.  No LMP recorded. G1P0010 Birth Control Method:  POP Menstrual Calendar(currently): regular light  Current complaints: none.   Current acute medical issues:  none   Recent Gynecologic History No LMP recorded. Last Pap: 11/2018,  normal Last mammogram: na,    Past Medical History:  Diagnosis Date   Allergy    skin sensitivity - itching   Anxiety    Dermatographia    Endometriosis    Stage 4    Past Surgical History:  Procedure Laterality Date   FRACTURE SURGERY     LEFT ARM   ROBOTIC ASSISTED LAPAROSCOPIC OVARIAN CYSTECTOMY Bilateral 12/05/2018   Procedure: XI ROBOTIC ASSISTED LAPAROSCOPIC BILATERAL OVARIAN CYSTECTOMY, LYSIS OF ADHESIONS;  Surgeon: Adolphus Birchwood, MD;  Location: [facility];  Service: Gynecology;  Laterality: Bilateral;   WISDOM TOOTH EXTRACTION      OB History     Gravida  1   Para      Term      Preterm      AB  1   Living         SAB  1   IAB      Ectopic      Multiple      Live Births              Social History   Socioeconomic History   Marital status: Married    Spouse name: Not on file   Number of children: Not on file   Years of education: Not on file   Highest education level: Not on file  Occupational History   Not on file  Tobacco Use   Smoking status: Never   Smokeless tobacco: Never  Vaping Use   Vaping status: Never Used  Substance and Sexual Activity   Alcohol use: Yes    Comment: occ glass of wine   Drug use: No   Sexual activity: Yes    Birth control/protection: None    Comment: last period 02-13-20.  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[employer]   Social Drivers of Health   Financial Resource Strain: Low Risk  (08/29/2023)   Overall Financial Resource Strain (CARDIA)    Difficulty of Paying Living Expenses: Not very hard  Food Insecurity: No Food  Insecurity (08/29/2023)   Hunger Vital Sign    Worried About Running Out of Food in the Last Year: Never true    Ran Out of Food in the Last Year: Never true  Transportation Needs: No Transportation Needs (08/29/2023)   PRAPARE - Administrator, Civil Service (Medical): No    Lack of Transportation (Non-Medical): No  Physical Activity: Sufficiently Active (08/29/2023)   Exercise Vital Sign    Days of Exercise per Week: 5 days    Minutes of Exercise per Session: 60 min  Stress: No Stress Concern Present (08/29/2023)   [employer] of Occupational Health - Occupational Stress Questionnaire    Feeling of Stress : Only a little  Social Connections: Moderately Integrated (08/29/2023)   Social Connection and Isolation Panel [NHANES]    Frequency of Communication with Friends and Family: More than three times a week    Frequency of Social Gatherings with Friends and Family: More than three times a week    Attends Religious Services: 1 to 4 times per  year    Active Member of Clubs or Organizations: No    Attends Banker Meetings: Never    Marital Status: Married    Family History  Problem Relation Age of Onset   Breast cancer Paternal Aunt    Other Paternal Grandmother        swelling in lymph nodes   Arthritis Maternal Grandmother    Heart attack Maternal Grandfather    Colon cancer Neg Hx    Pancreatic cancer Neg Hx    Esophageal cancer Neg Hx    Rectal cancer Neg Hx    Stomach cancer Neg Hx      Current Outpatient Medications:    FLUoxetine (PROZAC) 40 MG capsule, Take 1 capsule (40 mg total) by mouth daily., Disp: 90 capsule, Rfl: 3   hydrOXYzine (VISTARIL) 25 MG capsule, Take 1 capsule (25 mg total) by mouth every 8 (eight) hours as needed., Disp: 30 capsule, Rfl: 2   lisdexamfetamine (VYVANSE) 40 MG capsule, Take 1 capsule (40 mg total) by mouth daily before breakfast., Disp: 30 capsule, Rfl: 0   norethindrone (MICRONOR) 0.35 MG tablet, 1 daily, do not  take inactive pills, go straight to the next pack, Disp: 28 tablet, Rfl: 0   norethindrone (MICRONOR) 0.35 MG tablet, Take 1 tablet (0.35 mg total) by mouth daily., Disp: 28 tablet, Rfl: 11   EPINEPHrine 0.3 mg/0.3 mL IJ SOAJ injection, Inject 0.3 mg into the muscle as needed for anaphylaxis. (Patient not taking: Reported on 08/29/2023), Disp: 1 each, Rfl: 1   ibuprofen (ADVIL) 800 MG tablet, Take 1 tablet (800 mg total) by mouth every 8 (eight) hours as needed. (Patient not taking: Reported on 08/29/2023), Disp: 60 tablet, Rfl: 1   [START ON 09/14/2023] lisdexamfetamine (VYVANSE) 40 MG capsule, Take 1 capsule (40 mg total) by mouth daily before breakfast. (Patient not taking: Reported on 08/29/2023), Disp: 30 capsule, Rfl: 0   [START ON 10/14/2023] lisdexamfetamine (VYVANSE) 40 MG capsule, Take 1 capsule (40 mg total) by mouth daily before breakfast. (Patient not taking: Reported on 08/29/2023), Disp: 30 capsule, Rfl: 0   Multiple Vitamin (MULTIVITAMIN) tablet, Take 1 tablet by mouth daily. (Patient not taking: Reported on 08/29/2023), Disp: , Rfl:   Review of Systems  Review of Systems  Constitutional: Negative for fever, chills, weight loss, malaise/fatigue and diaphoresis.  HENT: Negative for hearing loss, ear pain, nosebleeds, congestion, sore throat, neck pain, tinnitus and ear discharge.   Eyes: Negative for blurred vision, double vision, photophobia, pain, discharge and redness.  Respiratory: Negative for cough, hemoptysis, sputum production, shortness of breath, wheezing and stridor.   Cardiovascular: Negative for chest pain, palpitations, orthopnea, claudication, leg swelling and PND.  Gastrointestinal: negative for abdominal pain. Negative for heartburn, nausea, vomiting, diarrhea, constipation, blood in stool and melena.  Genitourinary: Negative for dysuria, urgency, frequency, hematuria and flank pain.  Musculoskeletal: Negative for myalgias, back pain, joint pain and falls.  Skin: Negative for  itching and rash.  Neurological: Negative for dizziness, tingling, tremors, sensory change, speech change, focal weakness, seizures, loss of consciousness, weakness and headaches.  Endo/Heme/Allergies: Negative for environmental allergies and polydipsia. Does not bruise/bleed easily.  Psychiatric/Behavioral: Negative for depression, suicidal ideas, hallucinations, memory loss and substance abuse. The patient is not nervous/anxious and does not have insomnia.        Objective:  Blood pressure 126/79, pulse 89, height 5\' 4"  (1.626 m), weight 155 lb (70.3 kg).   Physical Exam  Vitals reviewed. Constitutional: She is oriented to person,  place, and time. She appears well-developed and well-nourished.  HENT:  Head: Normocephalic and atraumatic.        Right Ear: External ear normal.  Left Ear: External ear normal.  Nose: Nose normal.  Mouth/Throat: Oropharynx is clear and moist.  Eyes: Conjunctivae and EOM are normal. Pupils are equal, round, and reactive to light. Right eye exhibits no discharge. Left eye exhibits no discharge. No scleral icterus.  Neck: Normal range of motion. Neck supple. No tracheal deviation present. No thyromegaly present.  Cardiovascular: Normal rate, regular rhythm, normal heart sounds and intact distal pulses.  Exam reveals no gallop and no friction rub.   No murmur heard. Respiratory: Effort normal and breath sounds normal. No respiratory distress. She has no wheezes. She has no rales. She exhibits no tenderness.  GI: Soft. Bowel sounds are normal. She exhibits no distension and no mass. There is no tenderness. There is no rebound and no guarding.  Genitourinary:  Breasts no masses skin changes or nipple changes bilaterally      Vulva is normal without lesions Vagina is pink moist without discharge Cervix normal in appearance and pap is done Uterus is normal size shape and contour Adnexa is negative with normal sized ovaries   Musculoskeletal: Normal range of  motion. She exhibits no edema and no tenderness.  Neurological: She is alert and oriented to person, place, and time. She has normal reflexes. She displays normal reflexes. No cranial nerve deficit. She exhibits normal muscle tone. Coordination normal.  Skin: Skin is warm and dry. No rash noted. No erythema. No pallor.  Psychiatric: She has a normal mood and affect. Her behavior is normal. Judgment and thought content normal.       Medications Ordered at today's visit: Meds ordered this encounter  Medications   norethindrone (MICRONOR) 0.35 MG tablet    Sig: Take 1 tablet (0.35 mg total) by mouth daily.    Dispense:  28 tablet    Refill:  11    Other orders placed at today's visit: No orders of the defined types were placed in this encounter.    ASSESSMENT + PLAN:    ICD-10-CM   1. Well woman exam with routine gynecological exam  Z01.419     2. Encounter for gynecological examination with Papanicolaou smear of cervix  Z01.419 Cytology - PAP( [place])          Return in about 3 years (around 08/29/2026) for Follow up, with Dr Despina Hidden.

## 2023-08-30 LAB — CYTOLOGY - PAP
Adequacy: ABSENT
Chlamydia: NEGATIVE
Comment: NEGATIVE
Comment: NEGATIVE
Comment: NORMAL
Diagnosis: NEGATIVE
High risk HPV: NEGATIVE
Neisseria Gonorrhea: NEGATIVE

## 2023-08-31 ENCOUNTER — Encounter: Payer: Self-pay | Admitting: Obstetrics & Gynecology

## 2023-10-19 ENCOUNTER — Other Ambulatory Visit (HOSPITAL_COMMUNITY): Payer: Self-pay

## 2023-11-14 ENCOUNTER — Ambulatory Visit: Payer: Self-pay | Admitting: Family

## 2023-11-15 ENCOUNTER — Encounter: Payer: Self-pay | Admitting: Family

## 2023-11-21 NOTE — Telephone Encounter (Signed)
 WOULD YOU BE WILLING TO DO THIS VIRTUAL ?

## 2023-11-28 ENCOUNTER — Encounter: Payer: Self-pay | Admitting: Family

## 2023-11-28 ENCOUNTER — Telehealth: Payer: Self-pay | Admitting: Family

## 2023-11-28 DIAGNOSIS — Z79899 Other long term (current) drug therapy: Secondary | ICD-10-CM

## 2023-11-28 DIAGNOSIS — F900 Attention-deficit hyperactivity disorder, predominantly inattentive type: Secondary | ICD-10-CM

## 2023-11-28 MED ORDER — LISDEXAMFETAMINE DIMESYLATE 40 MG PO CAPS: 40.0000 mg | ORAL_CAPSULE | Freq: Every day | ORAL | 0 refills | Status: DC

## 2023-11-28 NOTE — Progress Notes (Signed)
 Virtual Visit Consent   Natalie Price, you are scheduled for a virtual visit with a [facility] provider today. Just as with appointments in the office, your consent must be obtained to participate. Your consent will be active for this visit and any virtual visit you may have with one of our providers in the next 365 days. If you have a MyChart account, a copy of this consent can be sent to you electronically.  As this is a virtual visit, video technology does not allow for your provider to perform a traditional examination. This may limit your provider's ability to fully assess your condition. If your provider identifies any concerns that need to be evaluated in person or the need to arrange testing (such as labs, EKG, etc.), we will make arrangements to do so. Although advances in technology are sophisticated, we cannot ensure that it will always work on either your end or our end. If the connection with a video visit is poor, the visit may have to be switched to a telephone visit. With either a video or telephone visit, we are not always able to ensure that we have a secure connection.  By engaging in this virtual visit, you consent to the provision of healthcare and authorize for your insurance to be billed (if applicable) for the services provided during this visit. Depending on your insurance coverage, you may receive a charge related to this service.  I need to obtain your verbal consent now. Are you willing to proceed with your visit today? [name] has provided verbal consent on 11/28/2023 for a virtual visit (video or telephone). Bari Learn, FNP  Date: 11/28/2023 12:05 PM   Virtual Visit via Video Note   I, Bari Learn, connected with  Natalie Price  ([id number], Oct 20, 1990) on 11/28/23 at 12:10 PM EDT by a video-enabled telemedicine application and verified that I am speaking with the correct person using two identifiers.  Location: Patient: Virtual Visit Location Patient:  Home Provider: Virtual Visit Location Provider: Home Office   I discussed the limitations of evaluation and management by telemedicine and the availability of in person appointments. The patient expressed understanding and agreed to proceed.    History of Present Illness: Natalie Price is a 33 y.o. who identifies as a female who was assigned female at birth, and is being seen today for She was started on Vyvanse . Prior to starting medication she is constantly over thinking simple things. Having a hard time stay focus and completing tasks. Having a hard time remembering to pay her bills. Reports she gets over stimulated.  Reports she starts multiple hobbies, but never completes any of them.    Reports since start medication she is able to stay focused and completing task.   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 (Patient not taking: Reported on 08/29/2023), Disp: 1 each, Rfl: 1   FLUoxetine  (PROZAC ) 40 MG capsule, Take 1 capsule (40 mg total) by mouth daily., Disp: 90 capsule, Rfl: 3  hydrOXYzine  (VISTARIL ) 25 MG capsule, Take 1 capsule (25 mg total) by mouth every 8 (eight) hours as needed., Disp: 30 capsule, Rfl: 2   lisdexamfetamine (VYVANSE ) 40 MG capsule, Take 1 capsule (40 mg total) by mouth daily before breakfast., Disp: 30 capsule, Rfl: 0   [START ON 12/27/2023] lisdexamfetamine (VYVANSE ) 40 MG capsule, Take 1 capsule (40 mg total) by mouth daily before breakfast.,  Disp: 30 capsule, Rfl: 0   [START ON 01/27/2024] lisdexamfetamine (VYVANSE ) 40 MG capsule, Take 1 capsule (40 mg total) by mouth daily before breakfast., Disp: 30 capsule, Rfl: 0   norethindrone  (MICRONOR ) 0.35 MG tablet, 1 daily, do not take inactive pills, go straight to the next pack, Disp: 28 tablet, Rfl: 0   norethindrone  (MICRONOR ) 0.35 MG tablet, Take 1 tablet (0.35 mg total) by mouth daily., Disp: 28 tablet, Rfl: 11  Observations/Objective: Patient is well-developed, well-nourished in no acute distress.  Resting comfortably  at home.  Head is normocephalic, atraumatic.  No labored breathing.  Speech is clear and coherent with logical content.  Patient is alert and oriented at baseline.   Assessment and Plan: 1. Attention deficit hyperactivity disorder (ADHD), predominantly inattentive type - lisdexamfetamine (VYVANSE ) 40 MG capsule; Take 1 capsule (40 mg total) by mouth daily before breakfast.  Dispense: 30 capsule; Refill: 0 - lisdexamfetamine (VYVANSE ) 40 MG capsule; Take 1 capsule (40 mg total) by mouth daily before breakfast.  Dispense: 30 capsule; Refill: 0 - lisdexamfetamine (VYVANSE ) 40 MG capsule; Take 1 capsule (40 mg total) by mouth daily before breakfast.  Dispense: 30 capsule; Refill: 0  2. Controlled substance agreement signed - lisdexamfetamine (VYVANSE ) 40 MG capsule; Take 1 capsule (40 mg total) by mouth daily before breakfast.  Dispense: 30 capsule; Refill: 0 - lisdexamfetamine (VYVANSE ) 40 MG capsule; Take 1 capsule (40 mg total) by mouth daily before breakfast.  Dispense: 30 capsule; Refill: 0 - lisdexamfetamine (VYVANSE ) 40 MG capsule; Take 1 capsule (40 mg total) by mouth daily before breakfast.  Dispense: 30 capsule; Refill: 0  Meds as prescribed Continue current medications  Behavior modification as needed Follow-up for recheck in 3 months   Follow Up Instructions: I discussed the assessment and treatment plan with the patient. The patient was provided an  opportunity to ask questions and all were answered. The patient agreed with the plan and demonstrated an understanding of the instructions.  A copy of instructions were sent to the patient via MyChart unless otherwise noted below.     The patient was advised to call back or seek an in-person evaluation if the symptoms worsen or if the condition fails to improve as anticipated.    Bari Learn, FNP

## 2023-11-28 NOTE — Patient Instructions (Signed)

## 2023-12-09 ENCOUNTER — Ambulatory Visit: Payer: Self-pay | Admitting: Family

## 2024-02-20 ENCOUNTER — Telehealth: Payer: Self-pay | Admitting: Family

## 2024-02-20 ENCOUNTER — Encounter: Payer: Self-pay | Admitting: Family

## 2024-02-20 DIAGNOSIS — F902 Attention-deficit hyperactivity disorder, combined type: Secondary | ICD-10-CM

## 2024-02-20 DIAGNOSIS — F411 Generalized anxiety disorder: Secondary | ICD-10-CM

## 2024-02-20 MED ORDER — LISDEXAMFETAMINE DIMESYLATE 50 MG PO CAPS: 50.0000 mg | ORAL_CAPSULE | Freq: Every day | ORAL | 0 refills | Status: DC

## 2024-02-20 MED ORDER — HYDROXYZINE PAMOATE 25 MG PO CAPS: 25.0000 mg | ORAL_CAPSULE | Freq: Three times a day (TID) | ORAL | 2 refills | Status: DC | PRN

## 2024-02-20 NOTE — Progress Notes (Signed)
 Virtual Visit Consent   Natalie Price, you are scheduled for a virtual visit with a [facility] provider today. Just as with appointments in the office, your consent must be obtained to participate. Your consent will be active for this visit and any virtual visit you may have with one of our providers in the next 365 days. If you have a MyChart account, a copy of this consent can be sent to you electronically.  As this is a virtual visit, video technology does not allow for your provider to perform a traditional examination. This may limit your provider's ability to fully assess your condition. If your provider identifies any concerns that need to be evaluated in person or the need to arrange testing (such as labs, EKG, etc.), we will make arrangements to do so. Although advances in technology are sophisticated, we cannot ensure that it will always work on either your end or our end. If the connection with a video visit is poor, the visit may have to be switched to a telephone visit. With either a video or telephone visit, we are not always able to ensure that we have a secure connection.  By engaging in this virtual visit, you consent to the provision of healthcare and authorize for your insurance to be billed (if applicable) for the services provided during this visit. Depending on your insurance coverage, you may receive a charge related to this service.  I need to obtain your verbal consent now. Are you willing to proceed with your visit today? Natalie Price has provided verbal consent on 02/20/2024 for a virtual visit (video or telephone). Natalie Learn, FNP  Date: 02/20/2024 11:43 AM   Virtual Visit via Video Note   I, Natalie Price, connected with  Natalie Price  ([id number], 11-11-1990) on 02/20/24 at 11:55 AM EDT by a video-enabled telemedicine application and verified that I am speaking with the correct person using two identifiers.  Location: Patient: Virtual Visit Location Patient:  Home Provider: Virtual Visit Location Provider: Home Office   I discussed the limitations of evaluation and management by telemedicine and the availability of in person appointments. The patient expressed understanding and agreed to proceed.    History of Present Illness: Natalie Price is a 33 y.o. who identifies as a female who was assigned female at birth, and is being seen today for ADHD refill. She was started on Vyvanse . Prior to starting medication she is constantly over thinking simple things. Having a hard time stay focus and completing tasks. Having a hard time remembering to pay her bills. Reports she gets over stimulated.  Reports she starts multiple hobbies, but never completes any of them.    Reports since start medication she is able to stay focused and completing task. She would like to try to increase slightly to help.   HPI: Anxiety Presents for follow-up visit. Symptoms include excessive worry, nervous/anxious behavior and restlessness. Symptoms occur occasionally. The severity of symptoms is moderate.      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) 11/17/2018    Allergies: No Known Allergies Medications:  Current Outpatient Medications:    lisdexamfetamine (VYVANSE ) 50 MG capsule, Take 1 capsule (50 mg total) by  mouth daily before breakfast., Disp: 30 capsule, Rfl: 0   [START ON 03/21/2024] lisdexamfetamine (VYVANSE ) 50 MG capsule, Take 1 capsule (50 mg total) by mouth daily before breakfast., Disp: 30 capsule, Rfl: 0   [START ON 04/20/2024] lisdexamfetamine (VYVANSE ) 50 MG capsule, Take 1 capsule (50 mg total) by mouth daily before breakfast., Disp: 30  capsule, Rfl: 0   EPINEPHrine  0.3 mg/0.3 mL IJ SOAJ injection, Inject 0.3 mg into the muscle as needed for anaphylaxis. (Patient not taking: Reported on 08/29/2023), Disp: 1 each, Rfl: 1   FLUoxetine  (PROZAC ) 40 MG capsule, Take 1 capsule (40 mg total) by mouth daily., Disp: 90 capsule, Rfl: 3   hydrOXYzine  (VISTARIL ) 25 MG capsule, Take 1-2 capsules (25-50 mg total) by mouth every 8 (eight) hours as needed., Disp: 90 capsule, Rfl: 2   norethindrone  (MICRONOR ) 0.35 MG tablet, 1 daily, do not take inactive pills, go straight to the next pack, Disp: 28 tablet, Rfl: 0   norethindrone  (MICRONOR ) 0.35 MG tablet, Take 1 tablet (0.35 mg total) by mouth daily., Disp: 28 tablet, Rfl: 11  Observations/Objective: Patient is well-developed, well-nourished in no acute distress.  Resting comfortably  at home.  Head is normocephalic, atraumatic.  No labored breathing.  Speech is clear and coherent with logical content.  Patient is alert and oriented at baseline.    Assessment and Plan: 1. Attention deficit hyperactivity disorder (ADHD), combined type (Primary) - lisdexamfetamine (VYVANSE ) 50 MG capsule; Take 1 capsule (50 mg total) by mouth daily before breakfast.  Dispense: 30 capsule; Refill: 0 - lisdexamfetamine (VYVANSE ) 50 MG capsule; Take 1 capsule (50 mg total) by mouth daily before breakfast.  Dispense: 30 capsule; Refill: 0 - lisdexamfetamine (VYVANSE ) 50 MG capsule; Take 1 capsule (50 mg total) by mouth daily before breakfast.  Dispense: 30 capsule; Refill: 0  2. GAD (generalized anxiety disorder) - hydrOXYzine  (VISTARIL ) 25 MG capsule; Take 1-2 capsules (25-50 mg total) by mouth every 8 (eight) hours as needed.  Dispense: 90 capsule; Refill: 2  Will increase Vyvanse  to 50 mg from 40 mg  Meds as prescribed Behavior modification as needed Follow-up for recheck in 3 months Will increase Vistaril  to 25-50 mg as needed for panic attack Stress management  Follow up in 3 months    Follow Up  Instructions: I discussed the assessment and treatment plan with the patient. The patient was provided an opportunity to ask questions and all were answered. The patient agreed with the plan and demonstrated an understanding of the instructions.  A copy of instructions were sent to the patient via MyChart unless otherwise noted below.     The patient was advised to call back or seek an in-person evaluation if the symptoms worsen or if the condition fails to improve as anticipated.    Natalie Learn, FNP

## 2024-02-23 ENCOUNTER — Encounter: Payer: Self-pay | Admitting: Obstetrics & Gynecology

## 2024-02-24 MED ORDER — NORETHINDRONE ACETATE 5 MG PO TABS: 5.0000 mg | ORAL_TABLET | Freq: Every day | ORAL | 11 refills | Status: AC

## 2024-03-06 ENCOUNTER — Telehealth: Payer: Self-pay | Admitting: Family

## 2024-03-06 DIAGNOSIS — F902 Attention-deficit hyperactivity disorder, combined type: Secondary | ICD-10-CM

## 2024-03-06 MED ORDER — LISDEXAMFETAMINE DIMESYLATE 50 MG PO CAPS
50.0000 mg | ORAL_CAPSULE | Freq: Every day | ORAL | 0 refills | Status: AC
Start: 2024-04-20 — End: 2024-05-20

## 2024-03-06 MED ORDER — LISDEXAMFETAMINE DIMESYLATE 50 MG PO CAPS: 50.0000 mg | ORAL_CAPSULE | Freq: Every day | ORAL | 0 refills | Status: DC

## 2024-03-06 NOTE — Telephone Encounter (Signed)
 Copied from CRM [id number]. Topic: Clinical - Prescription Issue >> Mar 06, 2024  3:05 PM Joesph B wrote: Reason for CRM: patient would like for her medication to be sent to [id number] - [place], [place] - [address]. [facility].  lisdexamfetamine (VYVANSE ) 50 MG capsule.

## 2024-03-06 NOTE — Addendum Note (Signed)
 Addended by: LAVELL LYE A on: 03/06/2024 03:53 PM   Modules accepted: Orders

## 2024-03-06 NOTE — Telephone Encounter (Signed)
Vyvanse Prescription sent to pharmacy  ? ?

## 2024-03-07 DIAGNOSIS — F902 Attention-deficit hyperactivity disorder, combined type: Secondary | ICD-10-CM

## 2024-03-09 ENCOUNTER — Telehealth: Payer: Self-pay

## 2024-03-09 MED ORDER — LISDEXAMFETAMINE DIMESYLATE 50 MG PO CAPS: 50.0000 mg | ORAL_CAPSULE | Freq: Every day | ORAL | 0 refills | Status: DC

## 2024-03-09 NOTE — Telephone Encounter (Signed)
 Refer to [employer]

## 2024-03-09 NOTE — Telephone Encounter (Signed)
 Copied from CRM [id number]. Topic: Clinical - Prescription Issue >> Mar 09, 2024  1:44 PM Wess RAMAN wrote: Reason for CRM: Pharmacy stated 3 prescriptions for lisdexamfetamine (VYVANSE ) 50 MG capsule were written, all for future dates. There is no prescription for the month of October.  [facility]acy[phone] [place], [place] - [address] [place] [address] Phone: [phone] Fax: [phone] Hours: Not open 24 hours

## 2024-06-05 ENCOUNTER — Other Ambulatory Visit: Payer: Self-pay | Admitting: Family

## 2024-06-05 DIAGNOSIS — F411 Generalized anxiety disorder: Secondary | ICD-10-CM

## 2024-06-15 ENCOUNTER — Ambulatory Visit (INDEPENDENT_AMBULATORY_CARE_PROVIDER_SITE_OTHER): Payer: Self-pay | Admitting: Family

## 2024-06-15 ENCOUNTER — Encounter: Payer: Self-pay | Admitting: Family

## 2024-06-15 VITALS — BP 132/86 | HR 74 | Temp 97.9°F | Ht 64.0 in | Wt 146.0 lb

## 2024-06-15 DIAGNOSIS — F902 Attention-deficit hyperactivity disorder, combined type: Secondary | ICD-10-CM

## 2024-06-15 DIAGNOSIS — N804 Endometriosis of rectovaginal septum, unspecified involvement of vagina: Secondary | ICD-10-CM

## 2024-06-15 DIAGNOSIS — N80109 Endometriosis of ovary, unspecified side, unspecified depth: Secondary | ICD-10-CM

## 2024-06-15 DIAGNOSIS — F411 Generalized anxiety disorder: Secondary | ICD-10-CM

## 2024-06-15 MED ORDER — LISDEXAMFETAMINE DIMESYLATE 50 MG PO CAPS: 50.0000 mg | ORAL_CAPSULE | Freq: Every day | ORAL | 0 refills | Status: AC

## 2024-06-15 MED ORDER — HYDROXYZINE PAMOATE 25 MG PO CAPS: 25.0000 mg | ORAL_CAPSULE | Freq: Three times a day (TID) | ORAL | 2 refills | Status: AC | PRN

## 2024-06-15 MED ORDER — IBUPROFEN 800 MG PO TABS: 800.0000 mg | ORAL_TABLET | Freq: Three times a day (TID) | ORAL | 2 refills | Status: AC | PRN

## 2024-06-15 MED ORDER — FLUOXETINE HCL 40 MG PO CAPS: 40.0000 mg | ORAL_CAPSULE | Freq: Every day | ORAL | 3 refills | Status: AC

## 2024-06-15 NOTE — Progress Notes (Signed)
 "  Subjective:    Patient ID: Natalie Price, female    DOB: [birth year], 34 y.o.   MRN: [id number]  Chief Complaint  Patient presents with   Medical Management of Chronic Issues    Pt presents to the office today for chronic follow up and ADHD refill. She is currently taking  Vyvanse   50 mg and doing well.   Prior to starting medication she is constantly over thinking simple things. Having a hard time stay focus and completing tasks. Having a hard time remembering to pay her bills. Reports she gets over stimulated.  Reports she starts multiple hobbies, but never completes any of them.   Reports since start medication she is able to stay focused and completing task.   She is following GYN and on OC. Has endometriosis and takes motrin  800 mg as needed. Anxiety Presents for follow-up visit. Symptoms include decreased concentration, excessive worry, nervous/anxious behavior, obsessions, palpitations and restlessness. Symptoms occur occasionally. The severity of symptoms is moderate.        Review of Systems  Cardiovascular:  Positive for palpitations.  Psychiatric/Behavioral:  Positive for decreased concentration. The patient is nervous/anxious.   All other systems reviewed and are negative.  Family History  Problem Relation Age of Onset   Breast cancer Paternal Aunt    Other Paternal Grandmother        swelling in lymph nodes   Arthritis Maternal Grandmother    Heart attack Maternal Grandfather    Colon cancer Neg Hx    Pancreatic cancer Neg Hx    Esophageal cancer Neg Hx    Rectal cancer Neg Hx    Stomach cancer Neg Hx    Social History   Socioeconomic History   Marital status: Married    Spouse name: Not on file   Number of children: Not on file   Years of education: Not on file   Highest education level: Not on file  Occupational History   Not on file  Tobacco Use   Smoking status: Never   Smokeless tobacco: Never  Vaping Use   Vaping status: Never Used   Substance and Sexual Activity   Alcohol use: Yes    Comment: occ glass of wine   Drug use: No   Sexual activity: Yes    Birth control/protection: None    Comment: last period 02-13-20.  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[employer]   Social Drivers of Health   Tobacco Use: Low Risk (06/15/2024)   Patient History    Smoking Tobacco Use: Never    Smokeless Tobacco Use: Never    Passive Exposure: Not on file  Financial Resource Strain: Low Risk (08/29/2023)   Overall Financial Resource Strain (CARDIA)    Difficulty of Paying Living Expenses: Not very hard  Food Insecurity: No Food Insecurity (08/29/2023)   Hunger Vital Sign    Worried About Running Out of Food in the Last Year: Never true    Ran Out of Food in the Last Year: Never true  Transportation Needs: No Transportation Needs (08/29/2023)   PRAPARE - Administrator, Civil Service (Medical): No    Lack of Transportation (Non-Medical): No  Physical Activity: Sufficiently Active (08/29/2023)   Exercise Vital Sign    Days of Exercise per Week: 5 days    Minutes of Exercise per Session: 60 min  Stress: No Stress Concern Present (08/29/2023)  [employer] of Occupational Health - Occupational Stress Questionnaire    Feeling of Stress : Only a little  Social Connections: Moderately Integrated (08/29/2023)   Social Connection and Isolation Panel    Frequency of Communication with Friends and Family: More than three times a week    Frequency of Social Gatherings with Friends and Family: More than three times a week    Attends Religious Services: 1 to 4 times per year    Active Member of Clubs or Organizations: No    Attends Banker Meetings: Never    Marital Status: Married  Depression (PHQ2-9): Low Risk (06/15/2024)   Depression (PHQ2-9)    PHQ-2 Score: 2  Alcohol Screen: Low Risk (08/29/2023)   Alcohol Screen    Last Alcohol Screening  Score (AUDIT): 2  Housing: Low Risk (08/29/2023)   Housing Stability Vital Sign    Unable to Pay for Housing in the Last Year: No    Number of Times Moved in the Last Year: 0    Homeless in the Last Year: No  Utilities: Not At Risk (08/29/2023)   AHC Utilities    Threatened with loss of utilities: No  Health Literacy: Not on file       Objective:   Physical Exam Vitals reviewed.  Constitutional:      General: She is not in acute distress.    Appearance: She is well-developed.  HENT:     Head: Normocephalic and atraumatic.     Right Ear: Tympanic membrane normal.     Left Ear: Tympanic membrane normal.  Eyes:     Pupils: Pupils are equal, round, and reactive to light.  Neck:     Thyroid: No thyromegaly.  Cardiovascular:     Rate and Rhythm: Normal rate and regular rhythm.     Heart sounds: Normal heart sounds. No murmur heard. Pulmonary:     Effort: Pulmonary effort is normal. No respiratory distress.     Breath sounds: Normal breath sounds. No wheezing.  Abdominal:     General: Bowel sounds are normal. There is no distension.     Palpations: Abdomen is soft.     Tenderness: There is no abdominal tenderness.  Musculoskeletal:        General: No tenderness. Normal range of motion.     Cervical back: Normal range of motion and neck supple.  Skin:    General: Skin is warm and dry.  Neurological:     Mental Status: She is alert and oriented to person, place, and time.     Cranial Nerves: No cranial nerve deficit.     Deep Tendon Reflexes: Reflexes are normal and symmetric.  Psychiatric:        Behavior: Behavior normal.        Thought Content: Thought content normal.        Judgment: Judgment normal.    BP 132/86   Pulse 74   Temp 97.9 F (36.6 C) (Temporal)   Ht 5' 4 (1.626 m)   Wt 146 lb (66.2 kg)   BMI 25.06 kg/m        Assessment & Plan:   Myli Pae comes in today with chief complaint of Medical Management of Chronic Issues   Diagnosis and orders  addressed:  1. Attention deficit hyperactivity disorder (ADHD), combined type Meds as prescribed Behavior modification as needed Follow-up for recheck in 3 months - lisdexamfetamine  (VYVANSE ) 50 MG capsule; Take 1 capsule (50 mg total) by mouth daily before breakfast.  Dispense: 30 capsule; Refill: 0 - lisdexamfetamine  (VYVANSE ) 50 MG capsule; Take 1 capsule (50 mg total) by mouth daily before breakfast.  Dispense: 30 capsule; Refill: 0 - lisdexamfetamine  (VYVANSE ) 50 MG capsule; Take 1 capsule (50 mg total) by mouth daily before breakfast. Take 1 capsule (50 mg total) by mouth daily before breakfast.  Dispense: 30 capsule; Refill: 0 - ToxASSURE Select 13 (MW), Urine  2. GAD (generalized anxiety disorder) (Primary) - FLUoxetine  (PROZAC ) 40 MG capsule; Take 1 capsule (40 mg total) by mouth daily.  Dispense: 90 capsule; Refill: 3 - hydrOXYzine  (VISTARIL ) 25 MG capsule; Take 1 capsule (25 mg total) by mouth every 8 (eight) hours as needed.  Dispense: 90 capsule; Refill: 2  3. Endometriosis of rectovaginal septum - ibuprofen  (ADVIL ) 800 MG tablet; Take 1 tablet (800 mg total) by mouth every 8 (eight) hours as needed.  Dispense: 60 tablet; Refill: 2  4. Endometriosis of ovary - ibuprofen  (ADVIL ) 800 MG tablet; Take 1 tablet (800 mg total) by mouth every 8 (eight) hours as needed.  Dispense: 60 tablet; Refill: 2   Labs pending Patient reviewed in [place] controlled database, no flags noted. Contract and drug screen are up to date.  Continue current medications  Health Maintenance reviewed Diet and exercise encouraged  Follow up plan: 3 months   Bari Learn, FNP  "

## 2024-06-15 NOTE — Patient Instructions (Signed)

## 2024-06-22 ENCOUNTER — Ambulatory Visit: Payer: Self-pay | Admitting: Family

## 2024-06-22 LAB — TOXASSURE SELECT 13 (MW), URINE

## 2024-09-17 ENCOUNTER — Ambulatory Visit: Payer: Self-pay | Admitting: Family
